# Patient Record
Sex: Female | Born: 1960 | Race: White | Hispanic: No | Marital: Married | Smoking: Never smoker
Health system: Southern US, Community
[De-identification: ages and names within clinical notes are randomized; demographics above are authoritative.]

## PROBLEM LIST (undated history)

## (undated) HISTORY — PX: KNEE SURGERY: SHX244

---

## 1997-04-03 ENCOUNTER — Encounter: Admission: RE | Admit: 1997-04-03 | Discharge: 1997-07-02 | Payer: Self-pay | Admitting: Obstetrics and Gynecology

## 1997-08-08 ENCOUNTER — Encounter (HOSPITAL_COMMUNITY): Admission: RE | Admit: 1997-08-08 | Discharge: 1997-11-06 | Payer: Self-pay | Admitting: *Deleted

## 1998-01-16 ENCOUNTER — Encounter (HOSPITAL_COMMUNITY): Admission: RE | Admit: 1998-01-16 | Discharge: 1998-04-16 | Payer: Self-pay | Admitting: *Deleted

## 1998-01-19 ENCOUNTER — Emergency Department (HOSPITAL_COMMUNITY): Admission: EM | Admit: 1998-01-19 | Discharge: 1998-01-19 | Payer: Self-pay | Admitting: Emergency Medicine

## 2000-12-29 ENCOUNTER — Other Ambulatory Visit: Admission: RE | Admit: 2000-12-29 | Discharge: 2000-12-29 | Payer: Self-pay | Admitting: Obstetrics and Gynecology

## 2008-01-05 ENCOUNTER — Encounter: Admission: RE | Admit: 2008-01-05 | Discharge: 2008-01-05 | Payer: Self-pay | Admitting: Obstetrics and Gynecology

## 2008-09-21 ENCOUNTER — Ambulatory Visit: Payer: Self-pay | Admitting: Sports Medicine

## 2008-09-21 DIAGNOSIS — M7750 Other enthesopathy of unspecified foot: Secondary | ICD-10-CM | POA: Insufficient documentation

## 2008-09-21 DIAGNOSIS — R269 Unspecified abnormalities of gait and mobility: Secondary | ICD-10-CM | POA: Insufficient documentation

## 2008-09-21 DIAGNOSIS — M25579 Pain in unspecified ankle and joints of unspecified foot: Secondary | ICD-10-CM | POA: Insufficient documentation

## 2008-09-27 ENCOUNTER — Telehealth: Payer: Self-pay | Admitting: Sports Medicine

## 2008-09-28 ENCOUNTER — Telehealth: Payer: Self-pay | Admitting: Sports Medicine

## 2008-10-05 ENCOUNTER — Ambulatory Visit: Payer: Self-pay | Admitting: Sports Medicine

## 2008-10-26 ENCOUNTER — Ambulatory Visit: Payer: Self-pay | Admitting: Sports Medicine

## 2009-09-19 ENCOUNTER — Ambulatory Visit: Payer: Self-pay | Admitting: Sports Medicine

## 2009-09-19 DIAGNOSIS — M25569 Pain in unspecified knee: Secondary | ICD-10-CM | POA: Insufficient documentation

## 2010-01-08 ENCOUNTER — Ambulatory Visit: Payer: Self-pay | Admitting: Sports Medicine

## 2010-01-08 DIAGNOSIS — M546 Pain in thoracic spine: Secondary | ICD-10-CM | POA: Insufficient documentation

## 2010-03-24 ENCOUNTER — Encounter: Payer: Self-pay | Admitting: Obstetrics and Gynecology

## 2010-04-02 NOTE — Assessment & Plan Note (Signed)
Summary: F/U [facility]   Vital Signs:  Patient profile:   50 year old female Pulse rate:   64 / minute BP sitting:   135 / 84  (right arm)  Vitals Entered By: Rochele Pages, RN CC: f/u left knee pain   CC:  f/u left knee pain.  History of Present Illness: Patient presents to clinic today for f/u of L knee pain which she reports is 85% improved.  Reports shooting pain in lateral left leg near IT band insertion on a 14 mile run while wearing sling shot.  Experineced this pain sensation on 2 other occassions without wearing sling shot.  Also experienced ache in mid back under mid scapula after lifting furniture 2 days ago with deep breathing.    generally much better able to finish a 21 mile run ha marathon this weekend  still gets mild swelling in left knee after long runs (.8 miles) - this is one with prior ACL surgery  Preventive Screening-Counseling & Management  Alcohol-Tobacco     Smoking Status: quit > 6 months     Year Quit: 1984  Social History: Smoking Status:  quit > 6 months  Physical Exam  General:  Well-developed,well-nourished,in no acute distress; alert,appropriate and cooperative throughout examination Msk:  knee exam shows no effusion; stable ligaments; negative Mcmurray's and provocative meniscal tests; non painful patellar compression; patellar and quadriceps tendons unremarkable. Left knee medial scar from old ACL surgery. No swelling left knee    mid back shows locked thoracic facet joints at level of bra line and just below tip of scapula on left only    Impression & Recommendations:  Problem # 1:  KNEE PAIN, LEFT (ICD-719.46) much imporved will stop use of sling shot keep up exercxise and running routine  Problem # 2:  BACK PAIN, THORACIC REGION (ICD-724.1) This is mild given wall w ball exercise and stretches  avoid spin until P marathon done  reck prn  Complete Medication List: 1)  Voltaren 1 % Gel (Diclofenac sodium) .... Apply to  affected area four time a day   Orders Added: 1)  Est. Patient Level III [16109]

## 2010-04-02 NOTE — Assessment & Plan Note (Signed)
Summary: L KNEE PAIN,MC   Vital Signs:  Patient profile:   50 year old female Weight:      128.38 pounds Pulse rate:   64 / minute BP sitting:   157 / 91  (right arm)  Vitals Entered By: Terese Door (September 19, 2009 1:50 PM) CC: left knee pain   CC:  left knee pain.  History of Present Illness: 89-yo female to office for L knee pain.  Pain in L knee intermittantly x several weeks - no pain x 3 days.  Some swelling/puffiness on infero-lateral knee.  Denies recent injury or trauma; tore ACL 15-years ago, required open ACL repair, she is unsure of type of repair.  Occ. clicking & popping, no locking, catching, giving-out.  She is training for [employer] marathon in November - using Chad method, averaging about 15-20 miles/week.  Also doing x-training 3x/wk with spinning.  Wearing Chopat strap during longer runs which seems to help.  Does not use orthotics, does change running shoes regularly.    Past History:  Past Surgical History: Open ACL repair 1995 - unsure of what type of repair completed  Review of Systems General:  Denies chills, fever, and weakness. CV:  Denies swelling of feet. MS:  Complains of joint pain and joint swelling; denies joint redness, loss of strength, low back pain, muscle aches, muscle weakness, and stiffness. Derm:  Denies lesion(s) and rash. Neuro:  Denies numbness, tingling, and weakness.  Physical Exam  General:  alert, well-developed, well-nourished, and normal appearance.   Lungs:  normal respiratory effort.   Msk:  KNEE: - L knee: small area of swelling noted over infero-lateral pole of patella.  No erythema.  Old surgical scar over medial aspect of knee.   Palpation reveals small amount of crepitus, no joint line tenderness, patellar tenderness, condyle tenderness.  No tenderness over patellar tendon or quad tendon. normal ROM in all planes.  Patella does have some tethering on lateral edge, likely related to previous surgery/scar tissue. ACL  with small amount of anterior translation with Lachman's compared to right knee, but solid endpoint attained. Neg varus/valgus stress testing, neg posterior drawer. Negative Mcmurray's and provocative meniscal tests. Non painful patellar compression. Hamstring and quadriceps strength is normal.  - R knee: no swelling or deformity, no crepitus,  no joint line tenderness, patellar tenderness, condyle tenderness.  No tenderness over patellar tendon or quad tendon.  normal ROM, no ligamentous laxity, neg McMurray's.    HIP: normal ROM, stength +5/5 in flexion/extension/int rot/ext rot/abduction/adduction. normal pelvis alignment.   No tenderness over IT-band, greater troch. No SI joint tenderness and normal minimal SI movement.  ANKLE: normal ROM, no deformity  FEET: normal arch, no hallux rigidis, no tenderness.  GAIT: observed both walking & jogging gait in office. no abnormalities noted. Extremities:  no edema Neurologic:  sensation intact to light touch.     Impression & Recommendations:  Problem # 1:  KNEE PAIN, LEFT (ICD-719.46) - May have component of patellar tendonitis or patellofemoral syndrome secondary to scar tissue from old ACL surgery - Should continue cross-training regularly to maintain good balanced leg strength. - May benefit from running of flat surfaces, any uneven surfaces will likely aggrevate symptoms - cont. Don[employer] running method for her marathon training. - Cont. chopat strap with activity - May use ice as needed - Use voltaren gel as needed - f/u as needed   Complete Medication List: 1)  Voltaren 1 % Gel (Diclofenac sodium) .... Apply to affected area  four time a day

## 2011-06-16 ENCOUNTER — Ambulatory Visit (INDEPENDENT_AMBULATORY_CARE_PROVIDER_SITE_OTHER): Payer: 59 | Admitting: Family Medicine

## 2011-06-16 ENCOUNTER — Encounter: Payer: Self-pay | Admitting: Family Medicine

## 2011-06-16 VITALS — BP 120/80 | Ht 64.0 in | Wt 118.0 lb

## 2011-06-16 DIAGNOSIS — M79609 Pain in unspecified limb: Secondary | ICD-10-CM

## 2011-06-16 DIAGNOSIS — M79672 Pain in left foot: Secondary | ICD-10-CM

## 2011-06-16 NOTE — Patient Instructions (Signed)
You have a 3rd metatarsal stress fracture No running, walking for exercise for next 2 weeks. Ice area 15 minutes at a time 3-4 times a day as needed for pain, swelling. Tylenol 2 tabs up to 4 times a day as needed for pain. There's some debate on if anti-inflammatories slow fracture healing but if you need to take something in addition to tylenol it's ok to take aleve or advil. Wear comfortable shoes (hard soled shoes or walking boot are considerations but I think this is overkill). Temporary orthotics are also a consideration to help with cushion. Elevate above the level of your heart as much as possible to help with swelling. I think you're not going to be able to do these races due to pain from your stress fracture - there is risk involved with doing this also (displacement, compensatory pain in other joints). Listen to your body - if pain is worse than 3 on a scale of 1-10 or you're limping, you should stop the activity. Follow up in 2 weeks for reevaluation (either here or in [place]).

## 2011-06-17 ENCOUNTER — Encounter: Payer: Self-pay | Admitting: Family Medicine

## 2011-06-17 DIAGNOSIS — M79672 Pain in left foot: Secondary | ICD-10-CM | POA: Insufficient documentation

## 2011-06-17 MED ORDER — DICLOFENAC SODIUM 1 % TD GEL: 1.0000 "application " | Freq: Four times a day (QID) | TRANSDERMAL | Status: DC

## 2011-06-17 NOTE — Progress Notes (Signed)
Addended by: Lenda Kelp on: 06/17/2011 12:19 PM   Modules accepted: Orders

## 2011-06-17 NOTE — Progress Notes (Signed)
  Subjective:    Patient ID: Samantha Kirby, female    DOB: Dec 03, 1960, 51 y.o.   MRN: [id number]  PCP: Julio Sicks  HPI 51 yo F here for left foot pain  Patient reports she is training for a half marathon. Up to 3-5 miles a week 3-4 times a week plus a long run on the weekends. She reports she's been stacking runs back to back recently though to get in more training quickly. Also using new [employer] shoes she bought 2 weeks ago. Reports on Wednesday and Thursday within past week had some pain during her usual runs. Then on Saturday during a hilly race had to stop because of severe dorsal left foot pain. Associated with swelling and some bruising. Has to walk on heel because of pain in forefoot. No prior h/o stress fracture. States she is having regular periods.  History reviewed. No pertinent past medical history.  No current outpatient prescriptions on file prior to visit.    Past Surgical History  Procedure Date  . Knee surgery     No Known Allergies  History   Social History  . Marital Status: Married    Spouse Name: N/A    Number of Children: N/A  . Years of Education: N/A   Occupational History  . Not on file.   Social History Main Topics  . Smoking status: Never Smoker   . Smokeless tobacco: Not on file  . Alcohol Use: Not on file  . Drug Use: Not on file  . Sexually Active: Not on file   Other Topics Concern  . Not on file   Social History Narrative  . No narrative on file    Family History  Problem Relation Age of Onset  . Sudden death Neg Hx   . Hypertension Neg Hx   . Hyperlipidemia Neg Hx   . Heart attack Neg Hx   . Diabetes Neg Hx     BP 120/80  Ht 5\' 4"  (1.626 m)  Wt 118 lb (53.524 kg)  BMI 20.25 kg/m2  Review of Systems See HPI above.    Objective:   Physical Exam Gen: NAD  L foot: Mild swelling - unable to visualize tendons and veins as well compared to right side due to swelling.   No bruising, other deformity. TTP 3rd  metatarsal distally more dorsally > plantar.  No other foot/ankle TTP. FROM ankle with 5/5 strength all directions and no pain. NVI distally.  MSK u/s: Visible fracture with approximately 1mm stepoff at area of maximal pain of distal 3rd metatarsal.  Mild neovascularity.  No visible callus.    Assessment & Plan:  1. Left 3rd metatarsal stress fracture - minimal displacement but should heal well with conservative therapy.  Discussed rest, icing, tylenol as needed for pain.  She declined more firm footwear (boot or postop shoes).  Also discussed additional cushion with temporary orthotics with arch support and forefoot cushion.  Otherwise activities as tolerated.  She really wants to do half marathon that is in about 2 weeks.  We discussed risks of this with known fracture, advised I would not go ahead with this so early into recovery. Anticipate total of 6 weeks for this to heal.  Has appointment with Dr. Darrick Penna next week - she is going to keep this.  Advised we also have comforthotics - can consider these in future.  See instructions for further.

## 2011-06-17 NOTE — Assessment & Plan Note (Signed)
Left 3rd metatarsal stress fracture - minimal displacement but should heal well with conservative therapy.  Discussed rest, icing, tylenol as needed for pain.  She declined more firm footwear (boot or postop shoes).  Also discussed additional cushion with temporary orthotics with arch support and forefoot cushion.  Otherwise activities as tolerated.  She really wants to do half marathon that is in about 2 weeks.  We discussed risks of this with known fracture, advised I would not go ahead with this so early into recovery. Anticipate total of 6 weeks for this to heal.  Has appointment with Dr. Darrick Penna next week - she is going to keep this.  Advised we also have comforthotics - can consider these in future.  See instructions for further.

## 2011-06-23 ENCOUNTER — Ambulatory Visit (INDEPENDENT_AMBULATORY_CARE_PROVIDER_SITE_OTHER): Payer: 59 | Admitting: Sports Medicine

## 2011-06-23 VITALS — BP 159/90

## 2011-06-23 DIAGNOSIS — S92309A Fracture of unspecified metatarsal bone(s), unspecified foot, initial encounter for closed fracture: Secondary | ICD-10-CM

## 2011-06-23 NOTE — Patient Instructions (Signed)
1. Wear your hard sole shoe and arch strap daily.  2. No running but it is ok to cross train on the stationary bike.  3. You can continue taking tylenol as needed for your pain.  4. Continue with your calcium and vitamin D supplements.  5. Start taking vitamin C daily.  6. Follow up with [place] in 2 weeks.

## 2011-06-25 DIAGNOSIS — S92309A Fracture of unspecified metatarsal bone(s), unspecified foot, initial encounter for closed fracture: Secondary | ICD-10-CM | POA: Insufficient documentation

## 2011-06-25 NOTE — Progress Notes (Signed)
  Subjective:    Patient ID: Samantha Kirby, female    DOB: 12-Dec-1960, 51 y.o.   MRN: [id number]  HPI 51 y/o female here for follow up one week s/p being diagnosed with a 3rd metatarsal stress fracture.  She has been symptomatic for 2 weeks.  During a run she felt the toe snap but was able to continue the 5K.  She is here today wondering if it would be ok to walk/run a half marathon this weekend.   Review of Systems     Objective:   Physical Exam  Left foot is not swollen There is ecchymosis on the the toes The distal third metatarsal is tender to palpation She is able to bear weight but walks with a limp  Ultrasound: Displaced distal 3rd metatarsal fracture.      Assessment & Plan:

## 2011-06-25 NOTE — Assessment & Plan Note (Signed)
Started hard sole shoe today and arch strap.  We will see her back in 2 weeks to ultrasound.  No running.  Cross training is ok.

## 2011-07-09 ENCOUNTER — Ambulatory Visit (INDEPENDENT_AMBULATORY_CARE_PROVIDER_SITE_OTHER): Payer: 59 | Admitting: Sports Medicine

## 2011-07-09 VITALS — BP 140/80

## 2011-07-09 DIAGNOSIS — S92309A Fracture of unspecified metatarsal bone(s), unspecified foot, initial encounter for closed fracture: Secondary | ICD-10-CM

## 2011-07-09 NOTE — Assessment & Plan Note (Signed)
Healing well. Callous and neovessels noted on [place]. Cont Arch strap. Avoid impact for 2 more weeks. RTC to rescan.

## 2011-07-09 NOTE — Progress Notes (Signed)
  Subjective:    Patient ID: Samantha Kirby, female    DOB: Oct 10, 1960, 51 y.o.   MRN: [id number]  HPI 4 weeks status post fracture of her left third metatarsal shaft while running a race. Overall almost pain-free. Wearing her arch strap.  Has x trained and avoided running   Review of Systems    No fevers, chills, night sweats, weight loss, chest pain, or shortness of breath.  Social History: Non-smoker. Objective:   Physical Exam General:  Well developed, well nourished, and in no acute distress. Neuro:  Alert and oriented x3, extra-ocular muscles intact. Skin: Warm and dry, no rashes noted. Respiratory:  Not using accessory muscles, speaking in full sentences. Musculoskeletal: Tender to palpation over left third metatarsal shaft.  MSK ultrasound again shows a step-off, and increased doppler flow + neovascularization seen. Small amount of callus seen.       Assessment & Plan:

## 2011-07-23 ENCOUNTER — Encounter: Payer: Self-pay | Admitting: Sports Medicine

## 2011-07-23 ENCOUNTER — Ambulatory Visit (INDEPENDENT_AMBULATORY_CARE_PROVIDER_SITE_OTHER): Payer: 59 | Admitting: Sports Medicine

## 2011-07-23 VITALS — BP 148/93 | HR 58 | Ht 64.0 in | Wt 118.0 lb

## 2011-07-23 DIAGNOSIS — S92309A Fracture of unspecified metatarsal bone(s), unspecified foot, initial encounter for closed fracture: Secondary | ICD-10-CM

## 2011-07-23 NOTE — Patient Instructions (Signed)
You have a large amount of advanced soft callus over the 3rd metatarsal fracture on the left  Stop strap - skin irritation  OK to start increasing weight bearing activity  Start walk run for a total of 20 minutes every other day x 3 sessions  Then 25 minutes 3 sessions every other day   Then 30 minutes 3 session every other day  IF not pain or worse swelling with this - you can start running  20 minutes continuous and increase 5 minutes every 3 to 4 runs  When you can run 30 minutes with no pain or swelling you are OK to train

## 2011-07-23 NOTE — Assessment & Plan Note (Signed)
this clinically is 90% healed She can discontinue the strap  Follow the protocol outlined in her instructions  She can progress back to running over the next 4-6 weeks if she continues healing well and will return to [place] when necessary

## 2011-07-23 NOTE — Progress Notes (Signed)
  Subjective:    Patient ID: Samantha Kirby, female    DOB: 1960/08/09, 51 y.o.   MRN: [id number]  HPI  Patient returns today for left third metatarsal fracture that was displaced about 30% She has been using arch strap until last week when it's cause some skin irritation She is in regular walking shoes without any excessive pain She continues to cross train and has avoided running  She states the last 3 days almost all of her pain is resolved She may have slight swelling at the end of the day but does not feel like she is limping her having much difficulty  Review of Systems     Objective:   Physical Exam No acute distress  No swelling noted in the left foot No tenderness to percussion or palpation over the second third fourth metatarsals On palpation there is thickening of the third metatarsal  MSK ultrasound There is significant advanced soft callus over the the distal third metatarsal with markedly increased Doppler activity This is seen on longitudinal and transverse scans Displacement is resolving Second and fourth metatarsals appear normal       Assessment & Plan:

## 2011-07-23 NOTE — Progress Notes (Deleted)
Patient ID: Samantha Kirby, female   DOB: 07-03-1960, 51 y.o.   MRN: [id number] HPI: pt presents today for f/u of 3rd metatarsal stress fx.  Pain much improved and able to walk without pain.  Not complaining of swelling.  PE:  MSK U/S- 3rd metatarsal shaft showing calcification indicating good healing and bone formation.  Doppler showed still has increased blood flow to area.

## 2011-08-26 ENCOUNTER — Other Ambulatory Visit: Payer: Self-pay | Admitting: Sports Medicine

## 2014-01-10 ENCOUNTER — Ambulatory Visit (INDEPENDENT_AMBULATORY_CARE_PROVIDER_SITE_OTHER): Payer: BC Managed Care – PPO | Admitting: Sports Medicine

## 2014-01-10 ENCOUNTER — Encounter: Payer: Self-pay | Admitting: Sports Medicine

## 2014-01-10 VITALS — BP 164/93 | Ht 64.0 in | Wt 112.0 lb

## 2014-01-10 DIAGNOSIS — M545 Low back pain, unspecified: Secondary | ICD-10-CM | POA: Insufficient documentation

## 2014-01-10 NOTE — Progress Notes (Signed)
Patient ID: Samantha Kirby, female   DOB: 01/07/1961, 53 y.o.   MRN: [id number]  CC: back pain HPI: 53yoF presents with 1.5 weeks of back pain. Pain started after cross training class with lots of lunges and burpees. Pain started in L buttock then moved to lower back, spreading like a band across her back. She runs 3-4 miles a few days a week and does spin or cross training classes most days. Running does not bother her back pain much. She feels about 40% better than when she first hurt her back. Back feels better with sitting.   OBJECTIVE: BP 164/93 mmHg  Ht 5\' 4"  (1.626 m)  Wt 112 lb (50.803 kg)  BMI 19.22 kg/m2 PE:  Gen: well, athletic, in NAD MSK /NEURO:  Normal inspection of lower back.  Pain with palpation b/l paraspinous areas. No point tenderness over vertebral bodies Slight pain with both ext and flex of back. Minimal pain rotation of back and side bends. Rotation and bending equal b/l. Normal heel and toe walk. Able to stand on the sides of her feet without difficulty. Great toe raise strength 5/5 b/l, dorsi flex, plantar flex 5/5 b/l Patellar reflex, ankle reflex 1+ b/l. Straight leg raise normal, no pain in calf or foot.  ASSESSMENT/ PLAN: Lumbar Fascial tear - gave core strengthening exercises - caution with crosstraining classes, spin classes ok.  - 2-3 ten minute walks a day, work up to 40min. Running ok to be part of that if not in pain after the first week  - Mild amount of pain ok, stop doing normal exercise routines if pain more than that.

## 2015-04-24 ENCOUNTER — Encounter: Payer: Self-pay | Admitting: Sports Medicine

## 2015-04-24 ENCOUNTER — Ambulatory Visit (INDEPENDENT_AMBULATORY_CARE_PROVIDER_SITE_OTHER): Payer: BLUE CROSS/BLUE SHIELD | Admitting: Sports Medicine

## 2015-04-24 VITALS — BP 162/98 | HR 62 | Ht 64.0 in | Wt 110.0 lb

## 2015-04-24 DIAGNOSIS — G5701 Lesion of sciatic nerve, right lower limb: Secondary | ICD-10-CM | POA: Insufficient documentation

## 2015-04-24 MED ORDER — CYCLOBENZAPRINE HCL 10 MG PO TABS: ORAL_TABLET | ORAL | Status: AC

## 2015-04-24 NOTE — Assessment & Plan Note (Signed)
Begin consistent pirofrmis stretch program  Start flexeril 5 to 10 at night  Careful with downhill running  If persistent problems p 3 to 4 weeks RTC for [place] and possible injection

## 2015-04-24 NOTE — Progress Notes (Signed)
Patient ID: Samantha Kirby, female   DOB: 07-17-1960, 55 y.o.   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

## 2015-04-24 NOTE — Patient Instructions (Signed)
For your hip pain  Keep up 3 stretches Hold off spinning until after the race Be careful with bike seat but also chairs Let's try flexeril at night and see if this helps you get better muscle relaxant  Spiky ball massage  See if this does not resolve

## 2015-04-25 ENCOUNTER — Ambulatory Visit: Payer: Self-pay | Admitting: Sports Medicine

## 2016-01-22 DIAGNOSIS — L821 Other seborrheic keratosis: Secondary | ICD-10-CM | POA: Diagnosis not present

## 2016-04-03 DIAGNOSIS — Z23 Encounter for immunization: Secondary | ICD-10-CM | POA: Diagnosis not present

## 2016-04-16 DIAGNOSIS — Z13820 Encounter for screening for osteoporosis: Secondary | ICD-10-CM | POA: Diagnosis not present

## 2016-04-16 DIAGNOSIS — Z6820 Body mass index (BMI) 20.0-20.9, adult: Secondary | ICD-10-CM | POA: Diagnosis not present

## 2016-04-16 DIAGNOSIS — Z01419 Encounter for gynecological examination (general) (routine) without abnormal findings: Secondary | ICD-10-CM | POA: Diagnosis not present

## 2016-04-16 DIAGNOSIS — Z1231 Encounter for screening mammogram for malignant neoplasm of breast: Secondary | ICD-10-CM | POA: Diagnosis not present

## 2016-06-04 DIAGNOSIS — M5431 Sciatica, right side: Secondary | ICD-10-CM | POA: Diagnosis not present

## 2016-06-04 DIAGNOSIS — M791 Myalgia: Secondary | ICD-10-CM | POA: Diagnosis not present

## 2016-06-04 DIAGNOSIS — M9903 Segmental and somatic dysfunction of lumbar region: Secondary | ICD-10-CM | POA: Diagnosis not present

## 2016-06-04 DIAGNOSIS — M9905 Segmental and somatic dysfunction of pelvic region: Secondary | ICD-10-CM | POA: Diagnosis not present

## 2017-02-19 DIAGNOSIS — Z1211 Encounter for screening for malignant neoplasm of colon: Secondary | ICD-10-CM | POA: Diagnosis not present

## 2017-02-19 DIAGNOSIS — R7989 Other specified abnormal findings of blood chemistry: Secondary | ICD-10-CM | POA: Diagnosis not present

## 2017-03-13 DIAGNOSIS — Z1211 Encounter for screening for malignant neoplasm of colon: Secondary | ICD-10-CM | POA: Diagnosis not present

## 2017-09-08 ENCOUNTER — Ambulatory Visit: Payer: BLUE CROSS/BLUE SHIELD | Admitting: Sports Medicine

## 2017-09-11 ENCOUNTER — Encounter: Payer: Self-pay | Admitting: Family Medicine

## 2017-09-11 ENCOUNTER — Ambulatory Visit: Payer: BLUE CROSS/BLUE SHIELD | Admitting: Family Medicine

## 2017-09-11 DIAGNOSIS — S8991XA Unspecified injury of right lower leg, initial encounter: Secondary | ICD-10-CM | POA: Diagnosis not present

## 2017-09-11 NOTE — Patient Instructions (Signed)
You have a knee sprain. Your exam and ultrasound are normal except a little bit of fluid in your knee. Avoid twisting, cutting, jumping. I'd wait a couple weeks before doing deep squats, deep lunges. Wait a week before starting the walk:jog progression. Continue with your knee sleeve. Icing 15 minutes at a time 3-4 times a day. Ibuprofen 600mg  three times a day with food OR aleve 2 tabs twice a day with food for pain and inflammation. Follow up with me in 5 weeks for reevaluation. Supplements we discussed: curcumin, pycnogenol, boswellia extract.

## 2017-09-13 ENCOUNTER — Encounter: Payer: Self-pay | Admitting: Family Medicine

## 2017-09-13 NOTE — Progress Notes (Signed)
PCP: System, Provider Not In  Subjective:   HPI: Patient is a 57 y.o. female here for right knee pain.  Patient reports she's had about 1 week of right knee pain. Started when she was a t a boot camp doing tug of war. Was pulling on rope and felt a pop laterally in right knee, went down. Has been icing, elevating. Didn't swell up as much as she expected. Some tightness. Taking tylenol, aleve. Using a sleeve. No catching, locking, giving out. Has been taking collagen, protein, and tumeric. Pain currently 0/10 level.  History reviewed. No pertinent past medical history.  Current Outpatient Medications on File Prior to Visit  Medication Sig Dispense Refill  . diclofenac sodium (VOLTAREN) 1 % GEL Apply 1 application topically 4 (four) times daily. (Patient not taking: Reported on 04/24/2015) 3 Tube 1   No current facility-administered medications on file prior to visit.     Past Surgical History:  Procedure Laterality Date  . KNEE SURGERY      No Known Allergies  Social History   Socioeconomic History  . Marital status: Married    Spouse name: Not on file  . Number of children: Not on file  . Years of education: Not on file  . Highest education level: Not on file  Occupational History  . Not on file  Social Needs  . Financial resource strain: Not on file  . Food insecurity:    Worry: Not on file    Inability: Not on file  . Transportation needs:    Medical: Not on file    Non-medical: Not on file  Tobacco Use  . Smoking status: Never Smoker  . Smokeless tobacco: Never Used  Substance and Sexual Activity  . Alcohol use: Not on file  . Drug use: Not on file  . Sexual activity: Not on file  Lifestyle  . Physical activity:    Days per week: Not on file    Minutes per session: Not on file  . Stress: Not on file  Relationships  . Social connections:    Talks on phone: Not on file    Gets together: Not on file    Attends religious service: Not on file   Active member of club or organization: Not on file    Attends meetings of clubs or organizations: Not on file    Relationship status: Not on file  . Intimate partner violence:    Fear of current or ex partner: Not on file    Emotionally abused: Not on file    Physically abused: Not on file    Forced sexual activity: Not on file  Other Topics Concern  . Not on file  Social History Narrative  . Not on file    Family History  Problem Relation Age of Onset  . Sudden death Neg Hx   . Hypertension Neg Hx   . Hyperlipidemia Neg Hx   . Heart attack Neg Hx   . Diabetes Neg Hx     BP (!) 144/90   Pulse (!) 56   Ht 5\' 4"  (1.626 m)   Wt 112 lb (50.8 kg)   BMI 19.22 kg/m   Review of Systems: See HPI above.     Objective:  Physical Exam:  Gen: NAD, comfortable in exam room  Right knee: Mild effusion.  No other gross deformity, ecchymoses. No TTP. FROM with 5/5 strength flexion/extension. Negative ant/post drawers. Negative valgus/varus testing. Negative lachmanns.  Negative levers. Negative mcmurrays, apleys, patellar apprehension,  thessalys, dial. NV intact distally.  Left knee: No deformity. FROM with 5/5 strength. No tenderness to palpation. NVI distally.   Assessment & Plan:  1. Right knee injury - noncontact injury.  Patient's exam is reassuring with exception of small effusion confirmed with ultrasound.  Consistent with knee sprain without complete tear.  Icing, knee sleeve, ibuprofen or aleve.  Quad strengthening exercises.  Wait couple weeks before doing squats, lunges.  Avoid twisting, jumping, cutting activities.  F/u in 5 weeks.

## 2017-09-14 ENCOUNTER — Encounter: Payer: Self-pay | Admitting: Family Medicine

## 2017-09-14 DIAGNOSIS — S8991XA Unspecified injury of right lower leg, initial encounter: Secondary | ICD-10-CM | POA: Insufficient documentation

## 2017-09-14 NOTE — Assessment & Plan Note (Signed)
noncontact injury.  Patient's exam is reassuring with exception of small effusion confirmed with ultrasound.  Consistent with knee sprain without complete tear.  Icing, knee sleeve, ibuprofen or aleve.  Quad strengthening exercises.  Wait couple weeks before doing squats, lunges.  Avoid twisting, jumping, cutting activities.  F/u in 5 weeks.

## 2017-09-17 ENCOUNTER — Ambulatory Visit: Payer: BLUE CROSS/BLUE SHIELD | Admitting: Sports Medicine

## 2017-09-17 ENCOUNTER — Encounter

## 2017-09-29 ENCOUNTER — Ambulatory Visit: Payer: BLUE CROSS/BLUE SHIELD | Admitting: Sports Medicine

## 2017-09-29 VITALS — BP 122/84 | Ht 64.0 in | Wt 112.0 lb

## 2017-09-29 DIAGNOSIS — S8991XA Unspecified injury of right lower leg, initial encounter: Secondary | ICD-10-CM

## 2017-09-29 NOTE — Assessment & Plan Note (Signed)
Much improved with rest and conservative care

## 2017-09-29 NOTE — Progress Notes (Signed)
   HPI  CC: Right knee pain [name] is a 57 year old female who presents for follow-up of right knee pain.  She was seen by Dr. Pearletha Kirby on July 12 of this year.  She states that her initial injury happened around July 4 during the tug-of-war match.  She states she felt a pop around that time.  She tried ice, NSAIDs, and essential oils and states that this is been helping.  She states she has been with a tolerate spin cycle classes and is pain-free.  She does not have any limits the range of motion.  She does not have any numbness and tingling.  She denies any weakness.  Is been doing 1 minute on 1 minute off run/walk.  She states is been on the tolerate this without any difficulty.  See HPI and/or previous note for associated ROS.  Objective: BP 122/84   Ht 5\' 4"  (1.626 m)   Wt 112 lb (50.8 kg)   BMI 19.22 kg/m  Gen:NAD, well groomed, normal affect.  CV: Well-perfused. Warm.  Resp: Non-labored.  Neuro: Sensation intact throughout. No gross coordination deficits. Patient is neurovascularly intact. Gait: Nonpathologic posture, unremarkable stride without signs of limp or balance issues.  Right knee exam: No erythema, warmth, swelling.  No tenderness to palpation.  Full active and passive range of motion.  5 out of 5 strength throughout.  Negative Lockman, posterior drawer, valgus/varus testing, McMurray.  ULTRASOUND: Knee, right   - Quadriceps tendon: No appreciated signs of tearing, edema, or calcification. No (compressible) fluid/edema noted within the suprapatellar pouch.  - Patellar tendon: No appreciated signs of tearing, edema, or calcification. No infrapatellar or tibial tuberosity fluid or abnormality appreciated.  - Medial joint line: No signs concerning for meniscal pathology appreciated. No increased fluid presence noted. No evidence of osteophyte development or significant joint space loss.  There is a small calcification present on the inferior edge of the meniscus without  signs of tear. - Lateral joint line: No signs concerning for meniscal pathology appreciated. No increased fluid presence noted. No evidence of osteophyte development or significant joint space loss.   IMPRESSION: findings consistent with normal knee exam.  Ultrasound and interpretation by Samantha ParrKarl B. Aziza Stuckert, MD    Assessment and plan:  Right knee pain, likely knee sprain.  Patient is improved since she was last seen by Dr. Pearletha Kirby.  She is without pain today.  She is able to resume her normal activity.  She has no restrictions from our end today.  She continued to increase her exercise, with pain being her guide to how much she can do.  Return to clinic as needed.  Samantha QuanBlake Dixon, MD [facility] 09/29/2017 1:54 PM   I observed and examined the patient with the [facility] and agree with assessment and plan.  Note reviewed and modified by me. Samantha BaasKarl Kynnedy Carreno, MD

## 2017-10-13 DIAGNOSIS — Z01419 Encounter for gynecological examination (general) (routine) without abnormal findings: Secondary | ICD-10-CM | POA: Diagnosis not present

## 2017-10-13 DIAGNOSIS — Z6821 Body mass index (BMI) 21.0-21.9, adult: Secondary | ICD-10-CM | POA: Diagnosis not present

## 2017-10-13 DIAGNOSIS — N390 Urinary tract infection, site not specified: Secondary | ICD-10-CM | POA: Diagnosis not present

## 2017-10-30 DIAGNOSIS — Z1231 Encounter for screening mammogram for malignant neoplasm of breast: Secondary | ICD-10-CM | POA: Diagnosis not present

## 2017-12-03 DIAGNOSIS — Z0000 Encounter for general adult medical examination without abnormal findings: Secondary | ICD-10-CM | POA: Diagnosis not present

## 2017-12-24 DIAGNOSIS — Z23 Encounter for immunization: Secondary | ICD-10-CM | POA: Diagnosis not present

## 2018-12-22 DIAGNOSIS — N393 Stress incontinence (female) (male): Secondary | ICD-10-CM | POA: Diagnosis not present

## 2018-12-22 DIAGNOSIS — Z6822 Body mass index (BMI) 22.0-22.9, adult: Secondary | ICD-10-CM | POA: Diagnosis not present

## 2018-12-22 DIAGNOSIS — R030 Elevated blood-pressure reading, without diagnosis of hypertension: Secondary | ICD-10-CM | POA: Diagnosis not present

## 2018-12-22 DIAGNOSIS — Z01419 Encounter for gynecological examination (general) (routine) without abnormal findings: Secondary | ICD-10-CM | POA: Diagnosis not present

## 2018-12-22 DIAGNOSIS — Z1231 Encounter for screening mammogram for malignant neoplasm of breast: Secondary | ICD-10-CM | POA: Diagnosis not present

## 2019-01-13 DIAGNOSIS — Z23 Encounter for immunization: Secondary | ICD-10-CM | POA: Diagnosis not present

## 2019-01-31 ENCOUNTER — Other Ambulatory Visit: Payer: Self-pay

## 2019-01-31 DIAGNOSIS — Z20822 Contact with and (suspected) exposure to covid-19: Secondary | ICD-10-CM

## 2019-02-01 LAB — NOVEL CORONAVIRUS, NAA: SARS-CoV-2, NAA: NOT DETECTED

## 2019-02-22 ENCOUNTER — Ambulatory Visit: Payer: BC Managed Care – PPO | Attending: Internal Medicine

## 2019-02-22 DIAGNOSIS — Z20828 Contact with and (suspected) exposure to other viral communicable diseases: Secondary | ICD-10-CM | POA: Diagnosis not present

## 2019-02-22 DIAGNOSIS — Z20822 Contact with and (suspected) exposure to covid-19: Secondary | ICD-10-CM

## 2019-02-23 LAB — NOVEL CORONAVIRUS, NAA: SARS-CoV-2, NAA: NOT DETECTED

## 2019-06-20 ENCOUNTER — Ambulatory Visit
Admission: RE | Admit: 2019-06-20 | Discharge: 2019-06-20 | Disposition: A | Discharge status: Home / Self Care | Payer: BC Managed Care – PPO | Source: Ambulatory Visit | Attending: Family Medicine | Admitting: Family Medicine

## 2019-06-20 ENCOUNTER — Other Ambulatory Visit: Payer: Self-pay | Admitting: *Deleted

## 2019-06-20 ENCOUNTER — Other Ambulatory Visit: Payer: Self-pay | Admitting: Family Medicine

## 2019-06-20 ENCOUNTER — Other Ambulatory Visit: Payer: Self-pay

## 2019-06-20 ENCOUNTER — Ambulatory Visit: Payer: BC Managed Care – PPO | Admitting: Family Medicine

## 2019-06-20 ENCOUNTER — Ambulatory Visit: Payer: Self-pay

## 2019-06-20 VITALS — BP 140/84 | Ht 64.0 in | Wt 110.0 lb

## 2019-06-20 DIAGNOSIS — M25522 Pain in left elbow: Secondary | ICD-10-CM

## 2019-06-20 DIAGNOSIS — M25532 Pain in left wrist: Secondary | ICD-10-CM

## 2019-06-20 DIAGNOSIS — S42452A Displaced fracture of lateral condyle of left humerus, initial encounter for closed fracture: Secondary | ICD-10-CM | POA: Diagnosis not present

## 2019-06-20 DIAGNOSIS — M25512 Pain in left shoulder: Secondary | ICD-10-CM | POA: Diagnosis not present

## 2019-06-20 DIAGNOSIS — S6992XA Unspecified injury of left wrist, hand and finger(s), initial encounter: Secondary | ICD-10-CM | POA: Diagnosis not present

## 2019-06-20 DIAGNOSIS — S4992XA Unspecified injury of left shoulder and upper arm, initial encounter: Secondary | ICD-10-CM | POA: Diagnosis not present

## 2019-06-20 MED ORDER — HYDROCODONE-ACETAMINOPHEN 5-325 MG PO TABS: 1.0000 | ORAL_TABLET | Freq: Four times a day (QID) | ORAL | 0 refills | Status: DC | PRN

## 2019-06-20 NOTE — Progress Notes (Signed)
Patient to get xrays before 4pm appt today

## 2019-06-21 ENCOUNTER — Other Ambulatory Visit: Payer: Self-pay | Admitting: Orthopaedic Surgery

## 2019-06-21 ENCOUNTER — Encounter: Payer: Self-pay | Admitting: Family Medicine

## 2019-06-21 DIAGNOSIS — M25522 Pain in left elbow: Secondary | ICD-10-CM | POA: Diagnosis not present

## 2019-06-21 DIAGNOSIS — S42402A Unspecified fracture of lower end of left humerus, initial encounter for closed fracture: Secondary | ICD-10-CM

## 2019-06-21 NOTE — Progress Notes (Signed)
PCP: System, Provider Not In  Subjective:   HPI: Patient is a 59 y.o. female here for left elbow injury.  Patient reports this morning she was trail running when she tripped on a root and sustained a FOOSH injury to her left upper extremity. Pain with wrist movements and very limited motion but localized swelling, difficulty moving elbow. No prior injuries to the elbow. No numbness, skin changes. She is right handed.  History reviewed. No pertinent past medical history.  No current outpatient medications on file prior to visit.   No current facility-administered medications on file prior to visit.    Past Surgical History:  Procedure Laterality Date  . KNEE SURGERY      Allergies  Allergen Reactions  . Sulfa Antibiotics Nausea Only    Social History   Socioeconomic History  . Marital status: Married    Spouse name: Not on file  . Number of children: Not on file  . Years of education: Not on file  . Highest education level: Not on file  Occupational History  . Not on file  Tobacco Use  . Smoking status: Never Smoker  . Smokeless tobacco: Never Used  Substance and Sexual Activity  . Alcohol use: Not on file  . Drug use: Not on file  . Sexual activity: Not on file  Other Topics Concern  . Not on file  Social History Narrative  . Not on file   Social Determinants of Health   Financial Resource Strain:   . Difficulty of Paying Living Expenses:   Food Insecurity:   . Worried About Programme researcher, broadcasting/film/video in the Last Year:   . Barista in the Last Year:   Transportation Needs:   . Freight forwarder (Medical):   Marland Kitchen Lack of Transportation (Non-Medical):   Physical Activity:   . Days of Exercise per Week:   . Minutes of Exercise per Session:   Stress:   . Feeling of Stress :   Social Connections:   . Frequency of Communication with Friends and Family:   . Frequency of Social Gatherings with Friends and Family:   . Attends Religious Services:   .  Active Member of Clubs or Organizations:   . Attends Banker Meetings:   Marland Kitchen Marital Status:   Intimate Partner Violence:   . Fear of Current or Ex-Partner:   . Emotionally Abused:   Marland Kitchen Physically Abused:   . Sexually Abused:     Family History  Problem Relation Age of Onset  . Sudden death Neg Hx   . Hypertension Neg Hx   . Hyperlipidemia Neg Hx   . Heart attack Neg Hx   . Diabetes Neg Hx     BP 140/84   Ht 5\' 4"  (1.626 m)   Wt 110 lb (49.9 kg)   BMI 18.88 kg/m   Review of Systems: See HPI above.     Objective:  Physical Exam:  Gen: NAD, comfortable in exam room  Left wrist: No deformity. Able to flex, extend wrist and digits.  5/5 strength with finger abduction, thumb opposition, finger extension.   No tenderness to palpation. NVI distally.  Left elbow: Large effusion.  No visible bruising.   Very limited motion in flexion, extension, supination, and pronation.  Held in about 70 degrees of flexion.  TTP diffusely about elbow but greatest supracondylar area, radial head, lateral epicondyle. NVI distally.   Left shoulder: No swelling, ecchymoses.  No gross deformity. No TTP. FROM.  Normal strength. NV intact distally.  Assessment & Plan:  1. Left elbow injury - independently reviewed radiographs of shoulder and wrist done prior to arrival and no abnormalities.  Brief MSK u/s shows large effusion with hemarthrosis.  No clear supracondylar fracture or radial head fracture though concern for elbow fracture - ordered radiographs of elbow.  Sling, norco as needed for pain.  Icing though instructed to take care around ulnar nerve.

## 2019-06-22 ENCOUNTER — Ambulatory Visit
Admission: RE | Admit: 2019-06-22 | Discharge: 2019-06-22 | Disposition: A | Discharge status: Home / Self Care | Payer: BC Managed Care – PPO | Source: Ambulatory Visit | Attending: Orthopaedic Surgery | Admitting: Orthopaedic Surgery

## 2019-06-22 ENCOUNTER — Other Ambulatory Visit: Payer: Self-pay | Admitting: Orthopaedic Surgery

## 2019-06-22 DIAGNOSIS — T148XXA Other injury of unspecified body region, initial encounter: Secondary | ICD-10-CM

## 2019-06-22 DIAGNOSIS — M25522 Pain in left elbow: Secondary | ICD-10-CM

## 2019-06-22 DIAGNOSIS — S42252A Displaced fracture of greater tuberosity of left humerus, initial encounter for closed fracture: Secondary | ICD-10-CM | POA: Diagnosis not present

## 2019-06-27 DIAGNOSIS — Y999 Unspecified external cause status: Secondary | ICD-10-CM | POA: Diagnosis not present

## 2019-06-27 DIAGNOSIS — S53432A Radial collateral ligament sprain of left elbow, initial encounter: Secondary | ICD-10-CM | POA: Diagnosis not present

## 2019-06-27 DIAGNOSIS — S53442A Ulnar collateral ligament sprain of left elbow, initial encounter: Secondary | ICD-10-CM | POA: Diagnosis not present

## 2019-06-27 DIAGNOSIS — S42452A Displaced fracture of lateral condyle of left humerus, initial encounter for closed fracture: Secondary | ICD-10-CM | POA: Diagnosis not present

## 2019-06-27 DIAGNOSIS — X58XXXA Exposure to other specified factors, initial encounter: Secondary | ICD-10-CM | POA: Diagnosis not present

## 2019-07-05 DIAGNOSIS — S42452A Displaced fracture of lateral condyle of left humerus, initial encounter for closed fracture: Secondary | ICD-10-CM | POA: Diagnosis not present

## 2019-07-05 DIAGNOSIS — S53442A Ulnar collateral ligament sprain of left elbow, initial encounter: Secondary | ICD-10-CM | POA: Diagnosis not present

## 2019-07-06 DIAGNOSIS — M25622 Stiffness of left elbow, not elsewhere classified: Secondary | ICD-10-CM | POA: Diagnosis not present

## 2019-07-06 DIAGNOSIS — M25522 Pain in left elbow: Secondary | ICD-10-CM | POA: Diagnosis not present

## 2019-07-06 DIAGNOSIS — M6281 Muscle weakness (generalized): Secondary | ICD-10-CM | POA: Diagnosis not present

## 2019-07-06 DIAGNOSIS — S42452D Displaced fracture of lateral condyle of left humerus, subsequent encounter for fracture with routine healing: Secondary | ICD-10-CM | POA: Diagnosis not present

## 2019-07-08 DIAGNOSIS — M6281 Muscle weakness (generalized): Secondary | ICD-10-CM | POA: Diagnosis not present

## 2019-07-08 DIAGNOSIS — M25622 Stiffness of left elbow, not elsewhere classified: Secondary | ICD-10-CM | POA: Diagnosis not present

## 2019-07-08 DIAGNOSIS — S42452D Displaced fracture of lateral condyle of left humerus, subsequent encounter for fracture with routine healing: Secondary | ICD-10-CM | POA: Diagnosis not present

## 2019-07-08 DIAGNOSIS — M25522 Pain in left elbow: Secondary | ICD-10-CM | POA: Diagnosis not present

## 2019-07-13 DIAGNOSIS — S42452D Displaced fracture of lateral condyle of left humerus, subsequent encounter for fracture with routine healing: Secondary | ICD-10-CM | POA: Diagnosis not present

## 2019-07-13 DIAGNOSIS — M25522 Pain in left elbow: Secondary | ICD-10-CM | POA: Diagnosis not present

## 2019-07-13 DIAGNOSIS — M6281 Muscle weakness (generalized): Secondary | ICD-10-CM | POA: Diagnosis not present

## 2019-07-13 DIAGNOSIS — M25622 Stiffness of left elbow, not elsewhere classified: Secondary | ICD-10-CM | POA: Diagnosis not present

## 2019-07-15 DIAGNOSIS — M25522 Pain in left elbow: Secondary | ICD-10-CM | POA: Diagnosis not present

## 2019-07-15 DIAGNOSIS — M25622 Stiffness of left elbow, not elsewhere classified: Secondary | ICD-10-CM | POA: Diagnosis not present

## 2019-07-15 DIAGNOSIS — S42452D Displaced fracture of lateral condyle of left humerus, subsequent encounter for fracture with routine healing: Secondary | ICD-10-CM | POA: Diagnosis not present

## 2019-07-15 DIAGNOSIS — M6281 Muscle weakness (generalized): Secondary | ICD-10-CM | POA: Diagnosis not present

## 2019-07-20 DIAGNOSIS — M25522 Pain in left elbow: Secondary | ICD-10-CM | POA: Diagnosis not present

## 2019-07-20 DIAGNOSIS — S42452D Displaced fracture of lateral condyle of left humerus, subsequent encounter for fracture with routine healing: Secondary | ICD-10-CM | POA: Diagnosis not present

## 2019-07-20 DIAGNOSIS — M25622 Stiffness of left elbow, not elsewhere classified: Secondary | ICD-10-CM | POA: Diagnosis not present

## 2019-07-20 DIAGNOSIS — M6281 Muscle weakness (generalized): Secondary | ICD-10-CM | POA: Diagnosis not present

## 2019-07-25 DIAGNOSIS — M25622 Stiffness of left elbow, not elsewhere classified: Secondary | ICD-10-CM | POA: Diagnosis not present

## 2019-07-25 DIAGNOSIS — S42452D Displaced fracture of lateral condyle of left humerus, subsequent encounter for fracture with routine healing: Secondary | ICD-10-CM | POA: Diagnosis not present

## 2019-07-25 DIAGNOSIS — M25522 Pain in left elbow: Secondary | ICD-10-CM | POA: Diagnosis not present

## 2019-07-25 DIAGNOSIS — M6281 Muscle weakness (generalized): Secondary | ICD-10-CM | POA: Diagnosis not present

## 2019-08-03 DIAGNOSIS — M25622 Stiffness of left elbow, not elsewhere classified: Secondary | ICD-10-CM | POA: Diagnosis not present

## 2019-08-03 DIAGNOSIS — S42452D Displaced fracture of lateral condyle of left humerus, subsequent encounter for fracture with routine healing: Secondary | ICD-10-CM | POA: Diagnosis not present

## 2019-08-03 DIAGNOSIS — M25522 Pain in left elbow: Secondary | ICD-10-CM | POA: Diagnosis not present

## 2019-08-03 DIAGNOSIS — M6281 Muscle weakness (generalized): Secondary | ICD-10-CM | POA: Diagnosis not present

## 2019-08-05 DIAGNOSIS — M6281 Muscle weakness (generalized): Secondary | ICD-10-CM | POA: Diagnosis not present

## 2019-08-05 DIAGNOSIS — S42452D Displaced fracture of lateral condyle of left humerus, subsequent encounter for fracture with routine healing: Secondary | ICD-10-CM | POA: Diagnosis not present

## 2019-08-05 DIAGNOSIS — M25662 Stiffness of left knee, not elsewhere classified: Secondary | ICD-10-CM | POA: Diagnosis not present

## 2019-08-05 DIAGNOSIS — M25522 Pain in left elbow: Secondary | ICD-10-CM | POA: Diagnosis not present

## 2019-08-12 DIAGNOSIS — S42452D Displaced fracture of lateral condyle of left humerus, subsequent encounter for fracture with routine healing: Secondary | ICD-10-CM | POA: Diagnosis not present

## 2019-08-12 DIAGNOSIS — M6281 Muscle weakness (generalized): Secondary | ICD-10-CM | POA: Diagnosis not present

## 2019-08-12 DIAGNOSIS — M25522 Pain in left elbow: Secondary | ICD-10-CM | POA: Diagnosis not present

## 2019-08-12 DIAGNOSIS — M25622 Stiffness of left elbow, not elsewhere classified: Secondary | ICD-10-CM | POA: Diagnosis not present

## 2019-08-15 DIAGNOSIS — M25522 Pain in left elbow: Secondary | ICD-10-CM | POA: Diagnosis not present

## 2019-08-15 DIAGNOSIS — S42452D Displaced fracture of lateral condyle of left humerus, subsequent encounter for fracture with routine healing: Secondary | ICD-10-CM | POA: Diagnosis not present

## 2019-08-15 DIAGNOSIS — M6281 Muscle weakness (generalized): Secondary | ICD-10-CM | POA: Diagnosis not present

## 2019-08-15 DIAGNOSIS — M25622 Stiffness of left elbow, not elsewhere classified: Secondary | ICD-10-CM | POA: Diagnosis not present

## 2019-08-16 DIAGNOSIS — M25522 Pain in left elbow: Secondary | ICD-10-CM | POA: Diagnosis not present

## 2019-08-17 DIAGNOSIS — M25522 Pain in left elbow: Secondary | ICD-10-CM | POA: Diagnosis not present

## 2019-08-17 DIAGNOSIS — M6281 Muscle weakness (generalized): Secondary | ICD-10-CM | POA: Diagnosis not present

## 2019-08-17 DIAGNOSIS — S42452D Displaced fracture of lateral condyle of left humerus, subsequent encounter for fracture with routine healing: Secondary | ICD-10-CM | POA: Diagnosis not present

## 2019-08-17 DIAGNOSIS — M25622 Stiffness of left elbow, not elsewhere classified: Secondary | ICD-10-CM | POA: Diagnosis not present

## 2019-08-26 DIAGNOSIS — S42452D Displaced fracture of lateral condyle of left humerus, subsequent encounter for fracture with routine healing: Secondary | ICD-10-CM | POA: Diagnosis not present

## 2019-08-26 DIAGNOSIS — M25522 Pain in left elbow: Secondary | ICD-10-CM | POA: Diagnosis not present

## 2019-08-26 DIAGNOSIS — M6281 Muscle weakness (generalized): Secondary | ICD-10-CM | POA: Diagnosis not present

## 2019-08-26 DIAGNOSIS — M25622 Stiffness of left elbow, not elsewhere classified: Secondary | ICD-10-CM | POA: Diagnosis not present

## 2019-08-31 DIAGNOSIS — S42452D Displaced fracture of lateral condyle of left humerus, subsequent encounter for fracture with routine healing: Secondary | ICD-10-CM | POA: Diagnosis not present

## 2019-08-31 DIAGNOSIS — M25522 Pain in left elbow: Secondary | ICD-10-CM | POA: Diagnosis not present

## 2019-08-31 DIAGNOSIS — M6281 Muscle weakness (generalized): Secondary | ICD-10-CM | POA: Diagnosis not present

## 2019-08-31 DIAGNOSIS — M25622 Stiffness of left elbow, not elsewhere classified: Secondary | ICD-10-CM | POA: Diagnosis not present

## 2019-09-02 DIAGNOSIS — M6281 Muscle weakness (generalized): Secondary | ICD-10-CM | POA: Diagnosis not present

## 2019-09-02 DIAGNOSIS — M25622 Stiffness of left elbow, not elsewhere classified: Secondary | ICD-10-CM | POA: Diagnosis not present

## 2019-09-02 DIAGNOSIS — M25522 Pain in left elbow: Secondary | ICD-10-CM | POA: Diagnosis not present

## 2019-09-02 DIAGNOSIS — S42452D Displaced fracture of lateral condyle of left humerus, subsequent encounter for fracture with routine healing: Secondary | ICD-10-CM | POA: Diagnosis not present

## 2019-09-09 DIAGNOSIS — S42452D Displaced fracture of lateral condyle of left humerus, subsequent encounter for fracture with routine healing: Secondary | ICD-10-CM | POA: Diagnosis not present

## 2019-09-09 DIAGNOSIS — M25622 Stiffness of left elbow, not elsewhere classified: Secondary | ICD-10-CM | POA: Diagnosis not present

## 2019-09-09 DIAGNOSIS — M25522 Pain in left elbow: Secondary | ICD-10-CM | POA: Diagnosis not present

## 2019-09-09 DIAGNOSIS — M6281 Muscle weakness (generalized): Secondary | ICD-10-CM | POA: Diagnosis not present

## 2019-09-14 DIAGNOSIS — M25522 Pain in left elbow: Secondary | ICD-10-CM | POA: Diagnosis not present

## 2019-09-14 DIAGNOSIS — M6281 Muscle weakness (generalized): Secondary | ICD-10-CM | POA: Diagnosis not present

## 2019-09-14 DIAGNOSIS — M25622 Stiffness of left elbow, not elsewhere classified: Secondary | ICD-10-CM | POA: Diagnosis not present

## 2019-09-14 DIAGNOSIS — S42452D Displaced fracture of lateral condyle of left humerus, subsequent encounter for fracture with routine healing: Secondary | ICD-10-CM | POA: Diagnosis not present

## 2019-09-19 DIAGNOSIS — M6281 Muscle weakness (generalized): Secondary | ICD-10-CM | POA: Diagnosis not present

## 2019-09-19 DIAGNOSIS — S42452D Displaced fracture of lateral condyle of left humerus, subsequent encounter for fracture with routine healing: Secondary | ICD-10-CM | POA: Diagnosis not present

## 2019-09-19 DIAGNOSIS — M25522 Pain in left elbow: Secondary | ICD-10-CM | POA: Diagnosis not present

## 2019-09-19 DIAGNOSIS — M25622 Stiffness of left elbow, not elsewhere classified: Secondary | ICD-10-CM | POA: Diagnosis not present

## 2019-09-23 DIAGNOSIS — M6281 Muscle weakness (generalized): Secondary | ICD-10-CM | POA: Diagnosis not present

## 2019-09-23 DIAGNOSIS — M25522 Pain in left elbow: Secondary | ICD-10-CM | POA: Diagnosis not present

## 2019-09-23 DIAGNOSIS — S42452D Displaced fracture of lateral condyle of left humerus, subsequent encounter for fracture with routine healing: Secondary | ICD-10-CM | POA: Diagnosis not present

## 2019-09-23 DIAGNOSIS — M25622 Stiffness of left elbow, not elsewhere classified: Secondary | ICD-10-CM | POA: Diagnosis not present

## 2019-09-26 DIAGNOSIS — M25522 Pain in left elbow: Secondary | ICD-10-CM | POA: Diagnosis not present

## 2019-09-26 DIAGNOSIS — M25622 Stiffness of left elbow, not elsewhere classified: Secondary | ICD-10-CM | POA: Diagnosis not present

## 2019-09-26 DIAGNOSIS — S42452D Displaced fracture of lateral condyle of left humerus, subsequent encounter for fracture with routine healing: Secondary | ICD-10-CM | POA: Diagnosis not present

## 2019-09-26 DIAGNOSIS — M6281 Muscle weakness (generalized): Secondary | ICD-10-CM | POA: Diagnosis not present

## 2019-09-27 DIAGNOSIS — M25522 Pain in left elbow: Secondary | ICD-10-CM | POA: Diagnosis not present

## 2019-09-30 DIAGNOSIS — M6281 Muscle weakness (generalized): Secondary | ICD-10-CM | POA: Diagnosis not present

## 2019-09-30 DIAGNOSIS — M25622 Stiffness of left elbow, not elsewhere classified: Secondary | ICD-10-CM | POA: Diagnosis not present

## 2019-09-30 DIAGNOSIS — M25522 Pain in left elbow: Secondary | ICD-10-CM | POA: Diagnosis not present

## 2019-09-30 DIAGNOSIS — S42452D Displaced fracture of lateral condyle of left humerus, subsequent encounter for fracture with routine healing: Secondary | ICD-10-CM | POA: Diagnosis not present

## 2019-10-07 DIAGNOSIS — M6281 Muscle weakness (generalized): Secondary | ICD-10-CM | POA: Diagnosis not present

## 2019-10-07 DIAGNOSIS — S42452D Displaced fracture of lateral condyle of left humerus, subsequent encounter for fracture with routine healing: Secondary | ICD-10-CM | POA: Diagnosis not present

## 2019-10-07 DIAGNOSIS — M25622 Stiffness of left elbow, not elsewhere classified: Secondary | ICD-10-CM | POA: Diagnosis not present

## 2019-10-07 DIAGNOSIS — M25522 Pain in left elbow: Secondary | ICD-10-CM | POA: Diagnosis not present

## 2019-10-14 DIAGNOSIS — M6281 Muscle weakness (generalized): Secondary | ICD-10-CM | POA: Diagnosis not present

## 2019-10-14 DIAGNOSIS — M25522 Pain in left elbow: Secondary | ICD-10-CM | POA: Diagnosis not present

## 2019-10-14 DIAGNOSIS — M25622 Stiffness of left elbow, not elsewhere classified: Secondary | ICD-10-CM | POA: Diagnosis not present

## 2019-10-14 DIAGNOSIS — S42452D Displaced fracture of lateral condyle of left humerus, subsequent encounter for fracture with routine healing: Secondary | ICD-10-CM | POA: Diagnosis not present

## 2019-10-19 DIAGNOSIS — M25522 Pain in left elbow: Secondary | ICD-10-CM | POA: Diagnosis not present

## 2019-10-19 DIAGNOSIS — S42452D Displaced fracture of lateral condyle of left humerus, subsequent encounter for fracture with routine healing: Secondary | ICD-10-CM | POA: Diagnosis not present

## 2019-10-19 DIAGNOSIS — M25622 Stiffness of left elbow, not elsewhere classified: Secondary | ICD-10-CM | POA: Diagnosis not present

## 2019-10-19 DIAGNOSIS — M6281 Muscle weakness (generalized): Secondary | ICD-10-CM | POA: Diagnosis not present

## 2019-10-28 DIAGNOSIS — M25622 Stiffness of left elbow, not elsewhere classified: Secondary | ICD-10-CM | POA: Diagnosis not present

## 2019-10-28 DIAGNOSIS — M25522 Pain in left elbow: Secondary | ICD-10-CM | POA: Diagnosis not present

## 2019-10-28 DIAGNOSIS — M6281 Muscle weakness (generalized): Secondary | ICD-10-CM | POA: Diagnosis not present

## 2019-10-28 DIAGNOSIS — S42452D Displaced fracture of lateral condyle of left humerus, subsequent encounter for fracture with routine healing: Secondary | ICD-10-CM | POA: Diagnosis not present

## 2019-11-04 DIAGNOSIS — S42452D Displaced fracture of lateral condyle of left humerus, subsequent encounter for fracture with routine healing: Secondary | ICD-10-CM | POA: Diagnosis not present

## 2019-11-04 DIAGNOSIS — M25622 Stiffness of left elbow, not elsewhere classified: Secondary | ICD-10-CM | POA: Diagnosis not present

## 2019-11-04 DIAGNOSIS — M6281 Muscle weakness (generalized): Secondary | ICD-10-CM | POA: Diagnosis not present

## 2019-11-04 DIAGNOSIS — M25522 Pain in left elbow: Secondary | ICD-10-CM | POA: Diagnosis not present

## 2019-11-23 DIAGNOSIS — S42452D Displaced fracture of lateral condyle of left humerus, subsequent encounter for fracture with routine healing: Secondary | ICD-10-CM | POA: Diagnosis not present

## 2019-11-23 DIAGNOSIS — M25622 Stiffness of left elbow, not elsewhere classified: Secondary | ICD-10-CM | POA: Diagnosis not present

## 2019-11-23 DIAGNOSIS — M25522 Pain in left elbow: Secondary | ICD-10-CM | POA: Diagnosis not present

## 2019-11-23 DIAGNOSIS — M6281 Muscle weakness (generalized): Secondary | ICD-10-CM | POA: Diagnosis not present

## 2019-12-01 DIAGNOSIS — Z23 Encounter for immunization: Secondary | ICD-10-CM | POA: Diagnosis not present

## 2019-12-08 DIAGNOSIS — Z13220 Encounter for screening for lipoid disorders: Secondary | ICD-10-CM | POA: Diagnosis not present

## 2019-12-08 DIAGNOSIS — Z23 Encounter for immunization: Secondary | ICD-10-CM | POA: Diagnosis not present

## 2019-12-08 DIAGNOSIS — Z0000 Encounter for general adult medical examination without abnormal findings: Secondary | ICD-10-CM | POA: Diagnosis not present

## 2020-03-27 DIAGNOSIS — D3132 Benign neoplasm of left choroid: Secondary | ICD-10-CM | POA: Diagnosis not present

## 2020-10-04 DIAGNOSIS — L814 Other melanin hyperpigmentation: Secondary | ICD-10-CM | POA: Diagnosis not present

## 2020-10-04 DIAGNOSIS — D1801 Hemangioma of skin and subcutaneous tissue: Secondary | ICD-10-CM | POA: Diagnosis not present

## 2020-10-04 DIAGNOSIS — L578 Other skin changes due to chronic exposure to nonionizing radiation: Secondary | ICD-10-CM | POA: Diagnosis not present

## 2020-10-04 DIAGNOSIS — L570 Actinic keratosis: Secondary | ICD-10-CM | POA: Diagnosis not present

## 2021-01-15 ENCOUNTER — Institutional Professional Consult (permissible substitution): Payer: BC Managed Care – PPO | Admitting: Plastic Surgery

## 2021-03-27 DIAGNOSIS — L720 Epidermal cyst: Secondary | ICD-10-CM | POA: Diagnosis not present

## 2021-03-27 DIAGNOSIS — D2239 Melanocytic nevi of other parts of face: Secondary | ICD-10-CM | POA: Diagnosis not present

## 2021-03-27 DIAGNOSIS — Z411 Encounter for cosmetic surgery: Secondary | ICD-10-CM | POA: Diagnosis not present

## 2021-05-21 DIAGNOSIS — D3132 Benign neoplasm of left choroid: Secondary | ICD-10-CM | POA: Diagnosis not present

## 2021-07-03 DIAGNOSIS — Z1231 Encounter for screening mammogram for malignant neoplasm of breast: Secondary | ICD-10-CM | POA: Diagnosis not present

## 2021-07-03 DIAGNOSIS — I10 Essential (primary) hypertension: Secondary | ICD-10-CM | POA: Diagnosis not present

## 2021-07-03 DIAGNOSIS — N393 Stress incontinence (female) (male): Secondary | ICD-10-CM | POA: Diagnosis not present

## 2021-07-03 DIAGNOSIS — Z124 Encounter for screening for malignant neoplasm of cervix: Secondary | ICD-10-CM | POA: Diagnosis not present

## 2021-07-03 DIAGNOSIS — Z6821 Body mass index (BMI) 21.0-21.9, adult: Secondary | ICD-10-CM | POA: Diagnosis not present

## 2021-07-03 DIAGNOSIS — Z1151 Encounter for screening for human papillomavirus (HPV): Secondary | ICD-10-CM | POA: Diagnosis not present

## 2021-07-03 DIAGNOSIS — Z01419 Encounter for gynecological examination (general) (routine) without abnormal findings: Secondary | ICD-10-CM | POA: Diagnosis not present

## 2021-07-08 DIAGNOSIS — Z13820 Encounter for screening for osteoporosis: Secondary | ICD-10-CM | POA: Diagnosis not present

## 2021-07-11 DIAGNOSIS — N393 Stress incontinence (female) (male): Secondary | ICD-10-CM | POA: Diagnosis not present

## 2021-07-11 DIAGNOSIS — M62838 Other muscle spasm: Secondary | ICD-10-CM | POA: Diagnosis not present

## 2021-07-11 DIAGNOSIS — R278 Other lack of coordination: Secondary | ICD-10-CM | POA: Diagnosis not present

## 2021-08-07 ENCOUNTER — Telehealth (INDEPENDENT_AMBULATORY_CARE_PROVIDER_SITE_OTHER): Payer: BC Managed Care – PPO | Admitting: Sports Medicine

## 2021-08-07 DIAGNOSIS — M8588 Other specified disorders of bone density and structure, other site: Secondary | ICD-10-CM

## 2021-08-07 DIAGNOSIS — M8580 Other specified disorders of bone density and structure, unspecified site: Secondary | ICD-10-CM | POA: Insufficient documentation

## 2021-08-07 NOTE — Assessment & Plan Note (Signed)
We had a long discussion and she clearly has a desire not to take medications at this time. For conservative care I think she is a good candidate with her very healthy lifestyle and consistent pattern of exercise. I did suggest that she start calcium 2500 mg daily and vitamin D 800 international units I would discuss these options with her primary care physician and see that he is in agreement. Should she have any abnormal fractures we would need to take a much more aggressive approach to treating her low bone density  She understands the discussions.  She will keep up an exercise program including weights and weightbearing yes size.  We will be happy to see her for this if she has further concerns

## 2021-08-07 NOTE — Progress Notes (Signed)
Chief complaint possible osteoporosis discussion  Patient requests a video visit with the patient in her home.  [facility]  Total time for the visit was 20 minutes.  Patient is a long-term runner who I have seen in the clinic for many years.  Currently her exercise pattern is to run regularly, she does Pilates she does weight exercise and she walks.  She consistently exercises pretty much every day.  Her diet is very well-balanced with the lot of vegetables, fruits she tends to limit the amount of red meat but does not totally abstain from this.  She is followed by Dr. Mertha Finders and has been healthy.  She has had a few elevated blood pressure readings but these seem to be whitecoat hypertension because her home blood pressure readings are consistently low.  She is not taking any regular medications  Family history is unremarkable for osteoporosis or abnormal fractures.  Mother does not have any osteoporosis.  Patient has been followed by her OB/GYN and her T-scores have worsened from 2018 when they were less than -2.  In most areas now they are just over -2 and the spine is slightly worse.  She has a history of 1 fracture of her left elbow but this was when she was Trail running fell over a root and landed hard on her left arm and hyperextended her elbow.  Physical exam I observed the patient walk mood and she seems trim and fit.  She checked her blood pressure and it was 114/70

## 2021-08-09 DIAGNOSIS — R030 Elevated blood-pressure reading, without diagnosis of hypertension: Secondary | ICD-10-CM | POA: Diagnosis not present

## 2021-08-09 DIAGNOSIS — M810 Age-related osteoporosis without current pathological fracture: Secondary | ICD-10-CM | POA: Diagnosis not present

## 2021-09-13 ENCOUNTER — Ambulatory Visit: Payer: BC Managed Care – PPO | Admitting: Family Medicine

## 2021-09-13 ENCOUNTER — Encounter: Payer: Self-pay | Admitting: Family Medicine

## 2021-09-13 VITALS — BP 120/84 | Ht 64.0 in | Wt 118.0 lb

## 2021-09-13 DIAGNOSIS — S29019A Strain of muscle and tendon of unspecified wall of thorax, initial encounter: Secondary | ICD-10-CM

## 2021-09-13 DIAGNOSIS — M545 Low back pain, unspecified: Secondary | ICD-10-CM

## 2021-09-13 MED ORDER — DICLOFENAC SODIUM 1 % EX GEL: 2.0000 g | Freq: Four times a day (QID) | CUTANEOUS | 2 refills | Status: AC | PRN

## 2021-09-13 NOTE — Patient Instructions (Signed)
You have a lumbar strain but also a spasm in your left thoracic paraspinal muscles. Do home exercises/stretches as directed. I would lay off running, jumping, twisting activities for the short term until pain is improved. Try voltaren gel up to 4 times a day, capsaicin 3-4 times a day. Ok to take tylenol if needed in addition to these. Consider massage especially to that spot in your mid-upper back. Keep the appointment you have with the acupuncturist on the 20th. Follow up with [place] in 1 month but call us sooner if you're struggling.

## 2021-09-13 NOTE — Progress Notes (Signed)
PCP: Marden Noble, MD  Subjective:   HPI: Patient is a 61 y.o. female here for low back pain.  Patient reports about 3 weeks ago she bent over to pick something up and felt something in her low back. No radicular symptoms at the time, pain was mild. Spread across low back and has been more persistent especially in the mornings since then. A little worse with exercise especially when she tries to run. Pain better as day goes on. Some radiation into left thigh but no numbness or tingling. Worse on soft mattress. Taken tylenol, motrin, supplement that contains turmeric. No bowel/bladder dysfunction. Also with localized pain left side of thoracic spine. Has appointment with acupuncturist 7/20.  History reviewed. No pertinent past medical history.  No current outpatient medications on file prior to visit.   No current facility-administered medications on file prior to visit.    Past Surgical History:  Procedure Laterality Date   KNEE SURGERY      Allergies  Allergen Reactions   Sulfa Antibiotics Nausea Only    BP 120/84   Ht 5\' 4"  (1.626 m)   Wt 118 lb (53.5 kg)   BMI 20.25 kg/m      09/13/2021   10:34 AM  [facility]  Frequency of aerobic exercise (# of days/week) 7  Average time in minutes 60  Frequency of strengthening activities (# of days/week) 5        No data to display              Objective:  Physical Exam:  Gen: NAD, comfortable in exam room  Back: No gross deformity, scoliosis. TTP left thoracic paraspinal muscles with spasm.  No midline or bony TTP.  No lumbar paraspinal tenderness. FROM with pain on extension. Strength LEs 5/5 all muscle groups.   Trace MSRs in patellar and achilles tendons, equal bilaterally. Negative SLRs. Sensation intact to light touch bilaterally. Negative faber, fadir.   Assessment & Plan:  1. Back pain - 2/2 deep posture muscle strain and localized left thoracic muscle strain.  Home  exercises and stretches reviewed.  Voltaren gel, capsaicin, tylenol.  Consider massage in addition to her appointment with acupuncturist in a week.  F/u in 1 month.

## 2021-09-24 ENCOUNTER — Ambulatory Visit: Payer: BC Managed Care – PPO | Admitting: Sports Medicine

## 2021-09-24 ENCOUNTER — Encounter: Payer: Self-pay | Admitting: Sports Medicine

## 2021-09-24 DIAGNOSIS — M545 Low back pain, unspecified: Secondary | ICD-10-CM

## 2021-09-24 NOTE — Assessment & Plan Note (Signed)
Today this seems that the gluteus medius weakness may have contributed to more left SIJ pain and sxs. We will try a series of stretches and HEP.

## 2021-09-24 NOTE — Patient Instructions (Signed)
It was great to see you today!  We discussed the following: -You have gluteus medius weakness and left SI joint syndrome. -Please do the exercises we gave you for your hip and SI joint -Call us with any questions.

## 2021-09-24 NOTE — Progress Notes (Signed)
PCP: Marden Noble, MD  Subjective:   HPI: Patient is a 61 y.o. female here for left low back pain.  She describes about 1 month of lower pain back, primarily on the left side. Patient reports that she bent over to pick something up about 1 month ago and felt something in her low back. The pain was mild, but she continued to exercise which she thinks worsened her pain.Since she was last seen, her pain has not improved significantly and she needed to completely rest for the last several days. She occasionally has radiation to the bilateral outer thighs, but it does not go beyond her knees. Her pain is worse with activity. She has tried Voltaren gel, ibuprofen and Tylenol as needed. She is very active running, doing piliates, HIIT classes, strength training. She tried acupuncture without relief.   History reviewed. No pertinent past medical history.  Current Outpatient Medications on File Prior to Visit  Medication Sig Dispense Refill   diclofenac Sodium (VOLTAREN) 1 % GEL Apply 2 g topically 4 (four) times daily as needed. 100 g 2   No current facility-administered medications on file prior to visit.    Past Surgical History:  Procedure Laterality Date   KNEE SURGERY      Allergies  Allergen Reactions   Sulfa Antibiotics Nausea Only    BP 120/78 (BP Location: Right Arm, Patient Position: Sitting, Cuff Size: Normal)   Ht 5\' 4"  (1.626 m)   Wt 115 lb (52.2 kg)   BMI 19.74 kg/m      09/13/2021   10:34 AM  [facility]  Frequency of aerobic exercise (# of days/week) 7  Average time in minutes 60  Frequency of strengthening activities (# of days/week) 5        No data to display              Objective:  Physical Exam:  Gen: NAD, comfortable in exam room Back: No TTP over the midline. Mild tenderness over left SI joint. No lumbar paraspinal tenderness. Negative FADIR, Positive FABER on the left. Negative SLRs bilaterally. Weakness of the left hip  abductors. Pretzel stretch seemed to relieve some of the sxs in left SIJ.   Assessment & Plan:  1. 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 Recommended to complete home exercise program. She should refrain from running for 2 weeks as she build strength in her abductors. She can continue ibuprofen, Tylenol, Voltaren, ice as needed. Follow up in 6 weeks.   09/15/2021, MS4  I observed and examined the patient with the medical student and agree with assessment and plan.  Note reviewed and modified by me. Timoteo Expose, MD

## 2021-10-08 DIAGNOSIS — L578 Other skin changes due to chronic exposure to nonionizing radiation: Secondary | ICD-10-CM | POA: Diagnosis not present

## 2021-10-08 DIAGNOSIS — D225 Melanocytic nevi of trunk: Secondary | ICD-10-CM | POA: Diagnosis not present

## 2021-10-08 DIAGNOSIS — D485 Neoplasm of uncertain behavior of skin: Secondary | ICD-10-CM | POA: Diagnosis not present

## 2021-10-08 DIAGNOSIS — L821 Other seborrheic keratosis: Secondary | ICD-10-CM | POA: Diagnosis not present

## 2021-10-08 DIAGNOSIS — L820 Inflamed seborrheic keratosis: Secondary | ICD-10-CM | POA: Diagnosis not present

## 2021-10-08 DIAGNOSIS — L814 Other melanin hyperpigmentation: Secondary | ICD-10-CM | POA: Diagnosis not present

## 2021-10-08 DIAGNOSIS — L570 Actinic keratosis: Secondary | ICD-10-CM | POA: Diagnosis not present

## 2021-10-08 DIAGNOSIS — B078 Other viral warts: Secondary | ICD-10-CM | POA: Diagnosis not present

## 2021-10-29 ENCOUNTER — Ambulatory Visit: Payer: BC Managed Care – PPO | Admitting: Sports Medicine

## 2021-10-29 DIAGNOSIS — M545 Low back pain, unspecified: Secondary | ICD-10-CM | POA: Diagnosis not present

## 2021-10-29 NOTE — Patient Instructions (Signed)
You have done very well  Keep up hip abduction exercise - lateral lifts Do others periodically Add extender exercise - lying on back - extending leg  Ease into running - 2 to 3 times per week  Keep all this up and see me if needed

## 2021-10-29 NOTE — Assessment & Plan Note (Signed)
Patient with significant improvement Continue to work on areas of muscular weakness She had some spasming on hamstring testing today so I gave her some extender exercises We will keep her working on back flexibility and hip strength She should be able to return to running If the problem she will return

## 2021-10-29 NOTE — Progress Notes (Signed)
Chief complaint follow-up of low back pain  Last visit patient was having some low back pain This would radiate into her left hip and and cause some discomfort while trying to run She started cutting back on her activities including her running She said on a scale of 100 it was about a 15 when this started  We placed her on a series of back stretches and hip abduction exercises on last visit She now says she is about an 85 out of 100 in terms of her pain and function She has tried running and did not have any significant pain with it but a little bit of tightness She feels if she keeps doing the exercises she should be back to 100%  Physical exam Pleasant athletic female in no acute distress BP 106/68   Ht 5\' 4"  (1.626 m)   Wt 115 lb (52.2 kg)   BMI 19.74 kg/m  Hip abduction has returned to normal strength All other hip muscles appear normal in strength Full mobility of her low back without pain Normal gait Normal strength on heel and toe walk

## 2021-11-12 DIAGNOSIS — B078 Other viral warts: Secondary | ICD-10-CM | POA: Diagnosis not present

## 2021-12-10 DIAGNOSIS — L820 Inflamed seborrheic keratosis: Secondary | ICD-10-CM | POA: Diagnosis not present

## 2021-12-10 DIAGNOSIS — B078 Other viral warts: Secondary | ICD-10-CM | POA: Diagnosis not present

## 2022-02-03 DIAGNOSIS — L820 Inflamed seborrheic keratosis: Secondary | ICD-10-CM | POA: Diagnosis not present

## 2022-02-03 DIAGNOSIS — Z23 Encounter for immunization: Secondary | ICD-10-CM | POA: Diagnosis not present

## 2022-02-03 DIAGNOSIS — B078 Other viral warts: Secondary | ICD-10-CM | POA: Diagnosis not present

## 2022-02-06 IMAGING — CT CT 3D INDEPENDENT WKST
2 of 4 series · 13 of 20 positions shown, 16 images · non-contrast
Comparison: X-ray 06/20/2019

CLINICAL DATA: Left elbow fracture

EXAM:
CT OF THE UPPER LEFT EXTREMITY WITHOUT CONTRAST
3-DIMENSIONAL CT IMAGE RENDERING ON ACQUISITION WORKSTATION
TECHNIQUE: Multidetector CT imaging of the upper left extremity was performed
according to the standard protocol.
3-dimensional CT images were rendered by post-processing of the
original CT data on an acquisition workstation. The 3-dimensional CT
images were interpreted and findings were reported in the
accompanying complete CT report for this study

[Series 4: ext soft · axial · 0.26mm/px · z∈[-988,-904]mm · 12 of 50 slices shown, 15 images]
[im 4/50  soft-tissue]
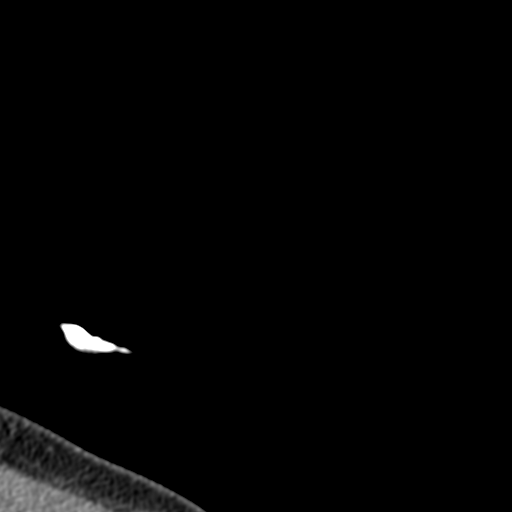
[im 4/50  bone]
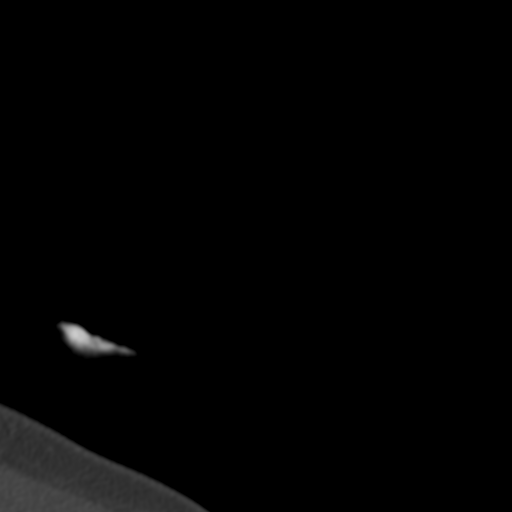
[im 8/50  bone]
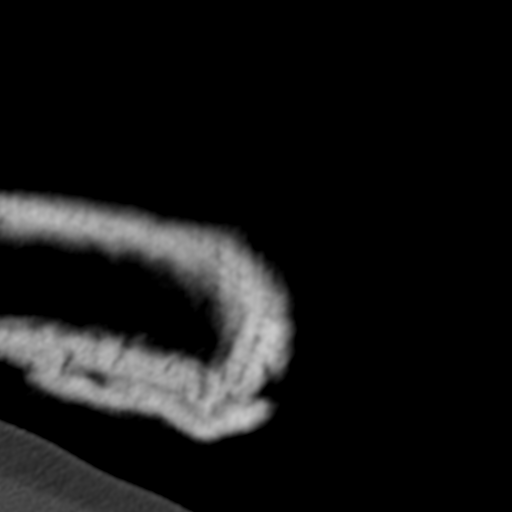
[im 12/50  bone]
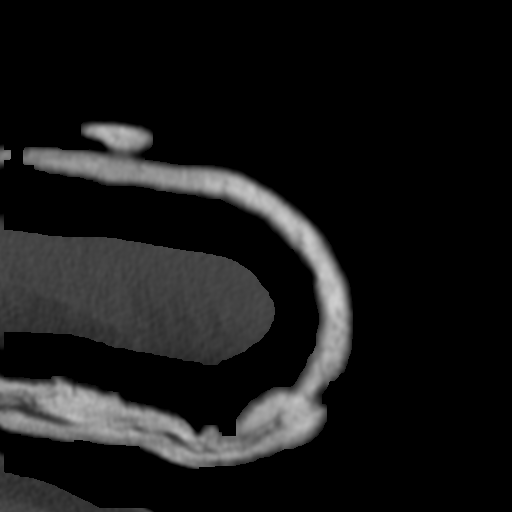
[im 16/50  bone]
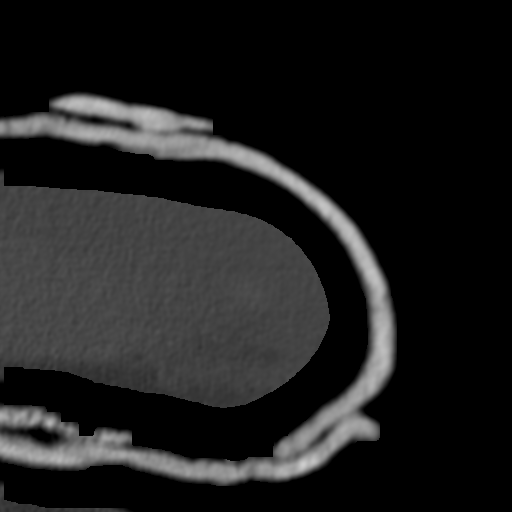
[im 19/50  soft-tissue]
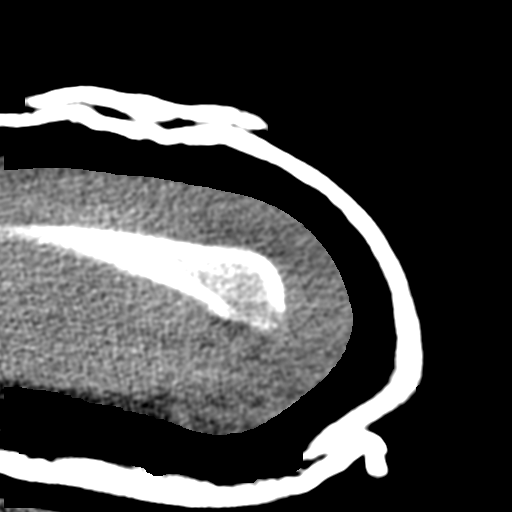
[im 19/50  bone]
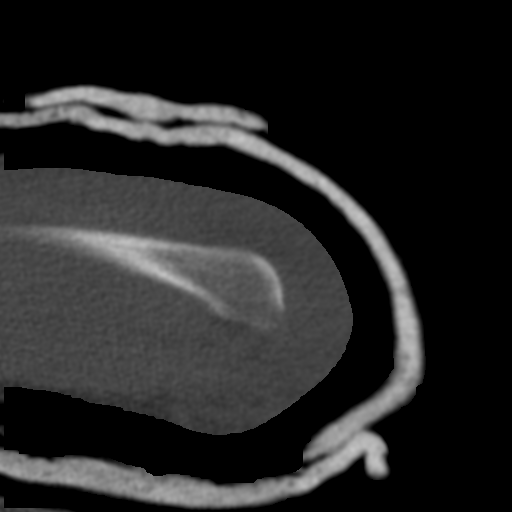
[im 23/50  bone]
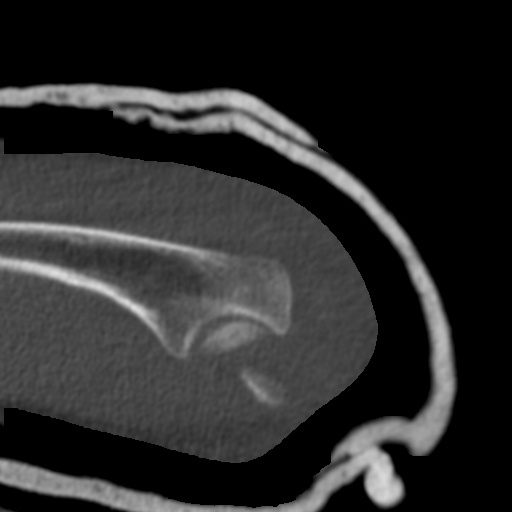
[im 27/50  bone]
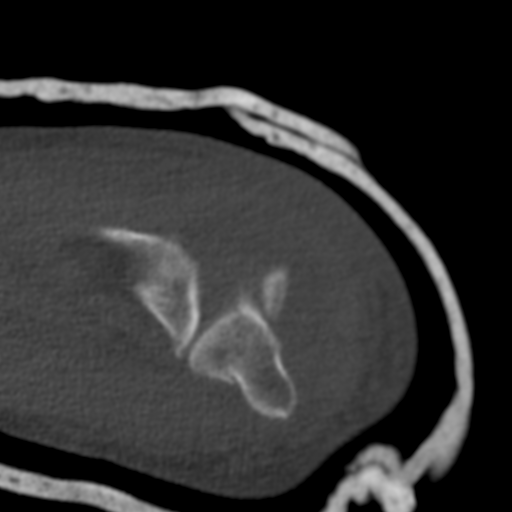
[im 31/50  bone]
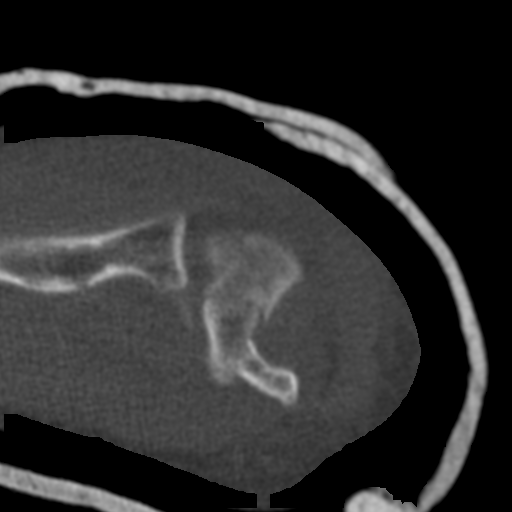
[im 34/50  soft-tissue]
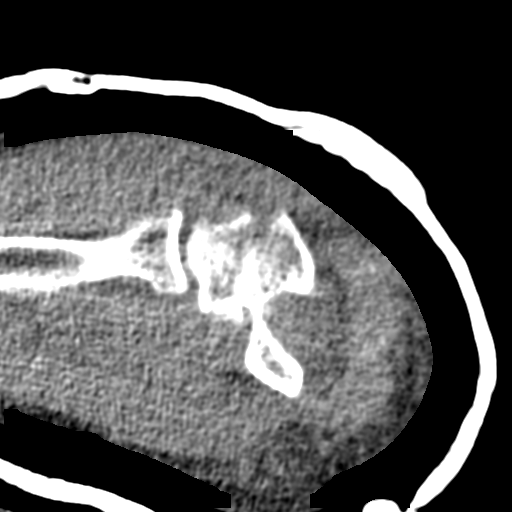
[im 34/50  bone]
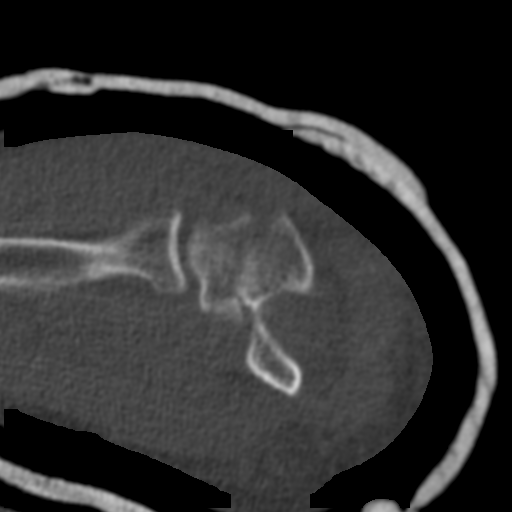
[im 38/50  bone]
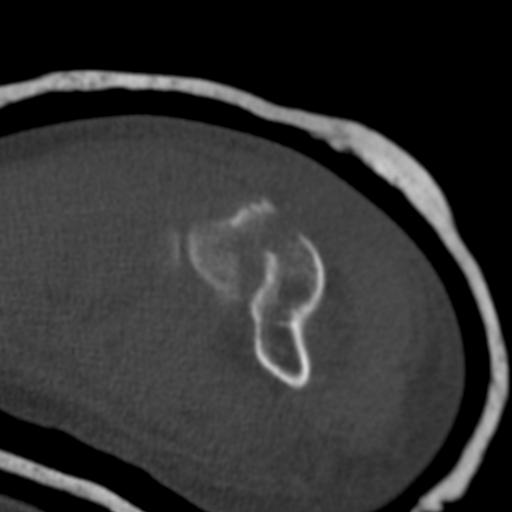
[im 42/50  bone]
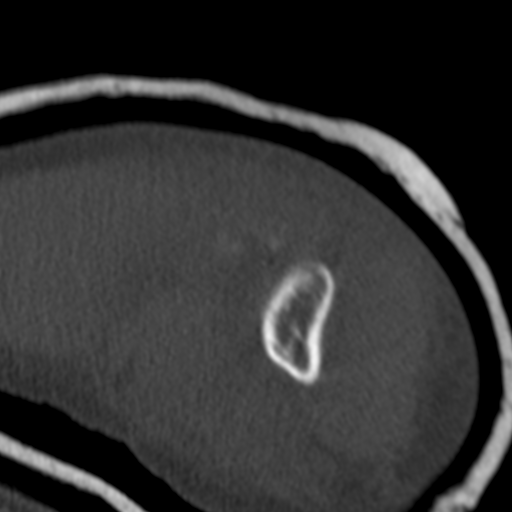
[im 46/50  bone]
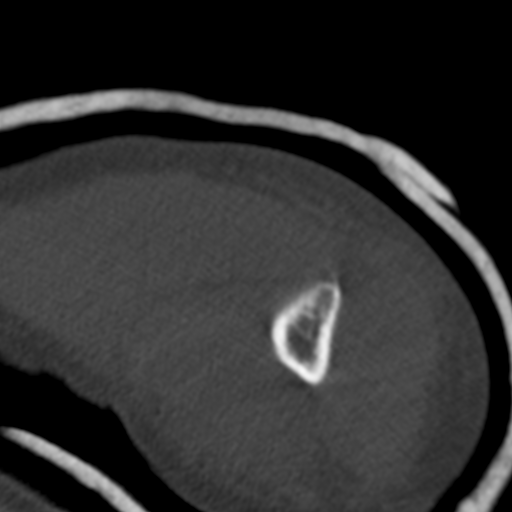

[Series 8: sag bone · coronal · 0.19mm/px · 1 of 59 slices shown]
[im 4/59  bone]
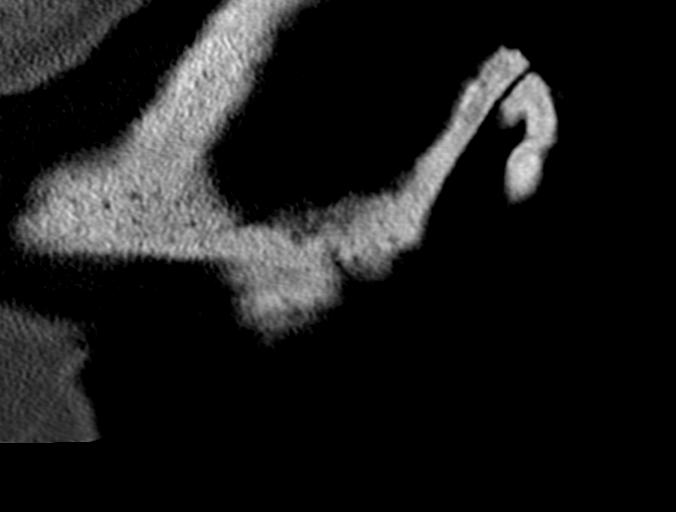

[13 of 20 positions shown; findings below may reference images not displayed]

FINDINGS: Evaluation is limited by beam hardening artifact related to
patient's overlying splint material.

Bones/Joint/Cartilage

Acute comminuted fracture of the lateral humeral condyle with 7 mm
of lateral displacement. Suspect extension to the posterior aspect
of the capitellar articular surface. Capitellum is dorsally
displaced by approximately 5 mm. Nondisplaced fracture component
extends through the medial humeral condyle with intra-articular
extension to the ulnotrochlear joint. Joint alignment is maintained
without dislocation. The proximal radius and ulna appear intact
without evidence of fracture. There is a large elbow joint
hemarthrosis.

Ligaments

Suboptimally assessed by CT.

Muscles and Tendons

Suboptimally evaluated. Triceps tendon appears grossly intact.

Soft tissues

Diffuse soft tissue swelling.
IMPRESSION: 1. Acute comminuted fracture of the lateral humeral condyle with 7
mm of lateral displacement. Suspect extension to the posterior
aspect of the capitellar articular surface.
2. Nondisplaced fracture component extends through the medial
humeral condyle with intra-articular extension to the ulnotrochlear
joint.
3. Large elbow joint hemarthrosis.

## 2023-10-12 ENCOUNTER — Ambulatory Visit: Admitting: Family Medicine

## 2023-10-14 ENCOUNTER — Ambulatory Visit: Payer: Self-pay | Admitting: Family Medicine

## 2023-10-14 VITALS — BP 116/82 | Ht 64.0 in | Wt 118.0 lb

## 2023-10-14 DIAGNOSIS — M810 Age-related osteoporosis without current pathological fracture: Secondary | ICD-10-CM | POA: Diagnosis not present

## 2023-10-14 NOTE — Patient Instructions (Addendum)
 Take calcium 1300mg  daily, vitamin D 800 international units daily. There's some evidence Vitamin K2 may be helpful - can discuss with somewhere like [facility] to get this. Continue with the exercise. Send me the labs you had recently (looking for CBC, CMP, 25-OH Vitamin D levels). Send me the supplements you've been looking into also. Let me know if you have any questions about osteoporosis in the meantime.

## 2023-10-15 ENCOUNTER — Encounter: Payer: Self-pay | Admitting: Family Medicine

## 2023-10-15 NOTE — Progress Notes (Signed)
 PCP: Marne Kelly Nest, MD  Subjective:   HPI: Patient is a 63 y.o. female here for osteoporosis evaluation.  Patient with new diagnosis of osteoporosis without history of fracture. Dexa performed 09/15/23 T scores at L spine -2.8, R femoral neck -2.4, L femoral neck -2.1. Prior treatment: none History of Hip, Spine, or Wrist Fracture: no Heart disease or stroke: no Cancer: no Kidney Disease: no Gastric/Peptic Ulcer: no Gastric bypass surgery: no Severe GERD: no History of seizures: no Age at Menopause: late 28s Calcium intake: yes but dosage not listed Vitamin D intake: yes, same as with calcium Hormone replacement therapy: no Smoking history: never Alcohol: none Exercise: 7 days per week Major dental work in past year: no Parents with hip/spine fracture: no   History reviewed. No pertinent past medical history.  Current Outpatient Medications on File Prior to Visit  Medication Sig Dispense Refill   diclofenac  Sodium (VOLTAREN ) 1 % GEL Apply 2 g topically 4 (four) times daily as needed. 100 g 2   No current facility-administered medications on file prior to visit.    Past Surgical History:  Procedure Laterality Date   KNEE SURGERY      Allergies  Allergen Reactions   Sulfa Antibiotics Nausea Only    BP 116/82   Ht 5' 4 (1.626 m)   Wt 118 lb (53.5 kg)   BMI 20.25 kg/m      09/13/2021   10:34 AM  [facility]  Frequency of aerobic exercise (# of days/week) 7  Average time in minutes 60  Frequency of strengthening activities (# of days/week) 5        No data to display              Objective:  Physical Exam:  Gen: NAD, comfortable in exam room  Dexa scan as above Labwork not available - DPR filled out to obtain CBC, CMP, Vit D   Assessment & Plan:  1. Osteoporosis - no high risk factors and not -3 or worse.  We discussed bisphosphonates and prolia today.  She would like to think about these but focus on calcium,  vitamin D, supplements, exercise.  Advised I do not think this will be enough to prevent progression and reduce risk of fracture.  Calcium 1300mg , vitamin D 800 international units daily.  Some evidence for Vitamin K2.  Continue exercise.  Obtain lab reports.  She will send us  supplements she's been looking into taking.    Total visit time 30 minutes including documentation.

## 2023-10-21 ENCOUNTER — Ambulatory Visit: Admitting: Family Medicine

## 2024-03-30 ENCOUNTER — Ambulatory Visit: Admitting: Family Medicine

## 2024-03-30 ENCOUNTER — Other Ambulatory Visit: Payer: Self-pay

## 2024-03-30 VITALS — BP 126/84 | Ht 64.0 in | Wt 115.0 lb

## 2024-03-30 DIAGNOSIS — M25562 Pain in left knee: Secondary | ICD-10-CM

## 2024-03-30 MED ORDER — MELOXICAM 15 MG PO TABS: 15.0000 mg | ORAL_TABLET | Freq: Every day | ORAL | 1 refills | Status: AC

## 2024-03-30 NOTE — Progress Notes (Signed)
" ° °  PCP: Marne Kelly Nest, MD  Patient is a 64 y.o. female here for L knee pain.  HPI - L knee pain for 3 weeks  - Patient woke up with the pain, not necessarily triggered by a certain activity and hasn't increased her activity recently - Very active (HIIT, Spin, Run, Weightlifting) - Has tried heat and ice and that helped, tried a combo Tylenol -Motrin which didn't help - Difficulty sitting criss cross - Subsides through the day, but worse in the morning - Pain worsened with the cold weather - Hx of ACL surgery on L knee - PT friend recommended getting Meloxicam   No past medical history on file.  Medications Ordered Prior to Encounter[1]  Past Surgical History:  Procedure Laterality Date   KNEE SURGERY      Allergies[2]  BP 126/84   Ht 5' 4 (1.626 m)   Wt 115 lb (52.2 kg)   BMI 19.74 kg/m      09/13/2021   10:34 AM  [facility]  Frequency of aerobic exercise (# of days/week) 7  Average time in minutes 60  Frequency of strengthening activities (# of days/week) 5        No data to display              Objective:  Physical Exam:  Left Knee Exam Gen: NAD, comfortable in exam room Inspection: No visible lesions, edema, erythema, overlying skin changes  Palpation: No TTP over medial or lateral joint lines. Mild TTP over medial hamstring insertion. ROM: FROM without pain Special Tests: Negative anterior/posterior drawer, negative valgus/varus, negative McMurrary's, and negative Thessaly's Strength: 5/5 strength with some pain posteriorly with resisted flexion NVI  Limited MSK u/s left knee: mod arthropathy, mild effusion.  Medial meniscus tear, likely degenerative. Medial hamstring tendons appear normal without tears, small area of edema surrounding semitendinosis.  Assessment and Plan:   Assessment & Plan Acute pain of left knee Symptomatology aligns with L hamstring tendinitis vs arthritis. 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  - Advised return to regular activities as tolerated - Meloxicam  15 mg daily PRN, Tylenol , Voltaren  gel PRN - Could consider PT to strengthen muscles around the joint if symptoms persist or worsen - Follow up in 1 month or as needed  Kathrine Melena, DO [facility]     [1]  Current Outpatient Medications on File Prior to Visit  Medication Sig Dispense Refill   diclofenac  Sodium (VOLTAREN ) 1 % GEL Apply 2 g topically 4 (four) times daily as needed. 100 g 2   No current facility-administered medications on file prior to visit.  [2]  Allergies Allergen Reactions   Sulfa Antibiotics Nausea Only   "

## 2024-03-30 NOTE — Patient Instructions (Signed)
 Your pain is due to arthritis. These are the different medications you can take for this: Tylenol  500mg  1-2 tabs three times a day for pain. Voltaren  gel, capsaicin, aspercreme, or biofreeze topically up to four times a day may also help with pain. Some supplements that may help for arthritis: Boswellia extract, curcumin, pycnogenol Meloxicam  15mg  daily with food for pain and inflammation as needed. Consider physical therapy to strengthen muscles around the joint that hurts to take pressure off of the joint itself. Shoe inserts with good arch support may be helpful. Heat or ice 15 minutes at a time 3-4 times a day as needed to help with pain. Follow up in 1 month or as needed.

## 2024-04-26 ENCOUNTER — Ambulatory Visit: Admitting: Sports Medicine
